# Patient Record
Sex: Female | Born: 1935 | Race: White | Hispanic: No | Smoking: Never smoker
Health system: Southern US, Community
[De-identification: ages and names within clinical notes are randomized; demographics above are authoritative.]

## PROBLEM LIST (undated history)

## (undated) DIAGNOSIS — K649 Unspecified hemorrhoids: Secondary | ICD-10-CM

## (undated) DIAGNOSIS — M1990 Unspecified osteoarthritis, unspecified site: Secondary | ICD-10-CM

## (undated) DIAGNOSIS — R32 Unspecified urinary incontinence: Secondary | ICD-10-CM

## (undated) DIAGNOSIS — I2510 Atherosclerotic heart disease of native coronary artery without angina pectoris: Secondary | ICD-10-CM

## (undated) DIAGNOSIS — K219 Gastro-esophageal reflux disease without esophagitis: Secondary | ICD-10-CM

## (undated) DIAGNOSIS — R251 Tremor, unspecified: Secondary | ICD-10-CM

## (undated) DIAGNOSIS — E559 Vitamin D deficiency, unspecified: Secondary | ICD-10-CM

## (undated) DIAGNOSIS — M75102 Unspecified rotator cuff tear or rupture of left shoulder, not specified as traumatic: Secondary | ICD-10-CM

## (undated) DIAGNOSIS — K635 Polyp of colon: Secondary | ICD-10-CM

## (undated) DIAGNOSIS — E7800 Pure hypercholesterolemia, unspecified: Secondary | ICD-10-CM

## (undated) DIAGNOSIS — E079 Disorder of thyroid, unspecified: Secondary | ICD-10-CM

## (undated) DIAGNOSIS — I519 Heart disease, unspecified: Secondary | ICD-10-CM

## (undated) DIAGNOSIS — G5600 Carpal tunnel syndrome, unspecified upper limb: Secondary | ICD-10-CM

## (undated) DIAGNOSIS — Z9889 Other specified postprocedural states: Secondary | ICD-10-CM

## (undated) DIAGNOSIS — I10 Essential (primary) hypertension: Secondary | ICD-10-CM

## (undated) DIAGNOSIS — D369 Benign neoplasm, unspecified site: Secondary | ICD-10-CM

## (undated) DIAGNOSIS — H8110 Benign paroxysmal vertigo, unspecified ear: Secondary | ICD-10-CM

## (undated) DIAGNOSIS — K5790 Diverticulosis of intestine, part unspecified, without perforation or abscess without bleeding: Secondary | ICD-10-CM

## (undated) DIAGNOSIS — G459 Transient cerebral ischemic attack, unspecified: Secondary | ICD-10-CM

## (undated) DIAGNOSIS — E039 Hypothyroidism, unspecified: Secondary | ICD-10-CM

## (undated) DIAGNOSIS — I639 Cerebral infarction, unspecified: Secondary | ICD-10-CM

## (undated) DIAGNOSIS — I253 Aneurysm of heart: Secondary | ICD-10-CM

## (undated) DIAGNOSIS — I700 Atherosclerosis of aorta: Secondary | ICD-10-CM

## (undated) DIAGNOSIS — Z7982 Long term (current) use of aspirin: Secondary | ICD-10-CM

## (undated) DIAGNOSIS — K449 Diaphragmatic hernia without obstruction or gangrene: Secondary | ICD-10-CM

## (undated) DIAGNOSIS — G479 Sleep disorder, unspecified: Secondary | ICD-10-CM

## (undated) HISTORY — PX: ABDOMINAL HYSTERECTOMY: SHX81

## (undated) HISTORY — PX: COLONOSCOPY: SHX174

## (undated) HISTORY — PX: TONSILLECTOMY: SUR1361

## (undated) HISTORY — PX: CARDIAC CATHETERIZATION: SHX172

## (undated) HISTORY — PX: EXCISION OF BREAST LESION: SHX6676

## (undated) HISTORY — PX: FOOT SURGERY: SHX648

---

## 2007-02-17 ENCOUNTER — Ambulatory Visit: Payer: Self-pay | Admitting: Gastroenterology

## 2007-07-22 ENCOUNTER — Ambulatory Visit: Payer: Self-pay | Admitting: Cardiology

## 2007-07-22 DIAGNOSIS — I2510 Atherosclerotic heart disease of native coronary artery without angina pectoris: Secondary | ICD-10-CM

## 2007-07-22 HISTORY — PX: LEFT HEART CATH AND CORONARY ANGIOGRAPHY: CATH118249

## 2007-07-22 HISTORY — DX: Atherosclerotic heart disease of native coronary artery without angina pectoris: I25.10

## 2009-02-08 ENCOUNTER — Ambulatory Visit: Payer: Self-pay | Admitting: Family Medicine

## 2009-03-07 ENCOUNTER — Ambulatory Visit: Payer: Self-pay | Admitting: Cardiology

## 2009-09-27 ENCOUNTER — Emergency Department: Payer: Self-pay | Admitting: Emergency Medicine

## 2009-11-12 ENCOUNTER — Ambulatory Visit: Payer: Self-pay | Admitting: Internal Medicine

## 2010-03-07 ENCOUNTER — Ambulatory Visit: Payer: Self-pay | Admitting: Family Medicine

## 2010-04-10 ENCOUNTER — Ambulatory Visit: Payer: Self-pay | Admitting: Gastroenterology

## 2010-04-12 LAB — PATHOLOGY REPORT

## 2011-03-25 ENCOUNTER — Ambulatory Visit: Payer: Self-pay | Admitting: Family Medicine

## 2011-05-08 IMAGING — CR DG HAND COMPLETE 3+V*L*
1 series · 3 of 3 positions shown · non-contrast
Comparison: none

REASON FOR EXAM: pain and swelling
COMMENTS:   May transport without cardiac monitor

PROCEDURE:     DXR - DXR HAND LT COMPLETE  W/OBLIQUES  - September 27, 2009  [DATE]
RESULT:     Comparison:  None

[Series 1: view not recorded · 0.17mm/px · 3 of 3 slices shown]
[im 1/3]
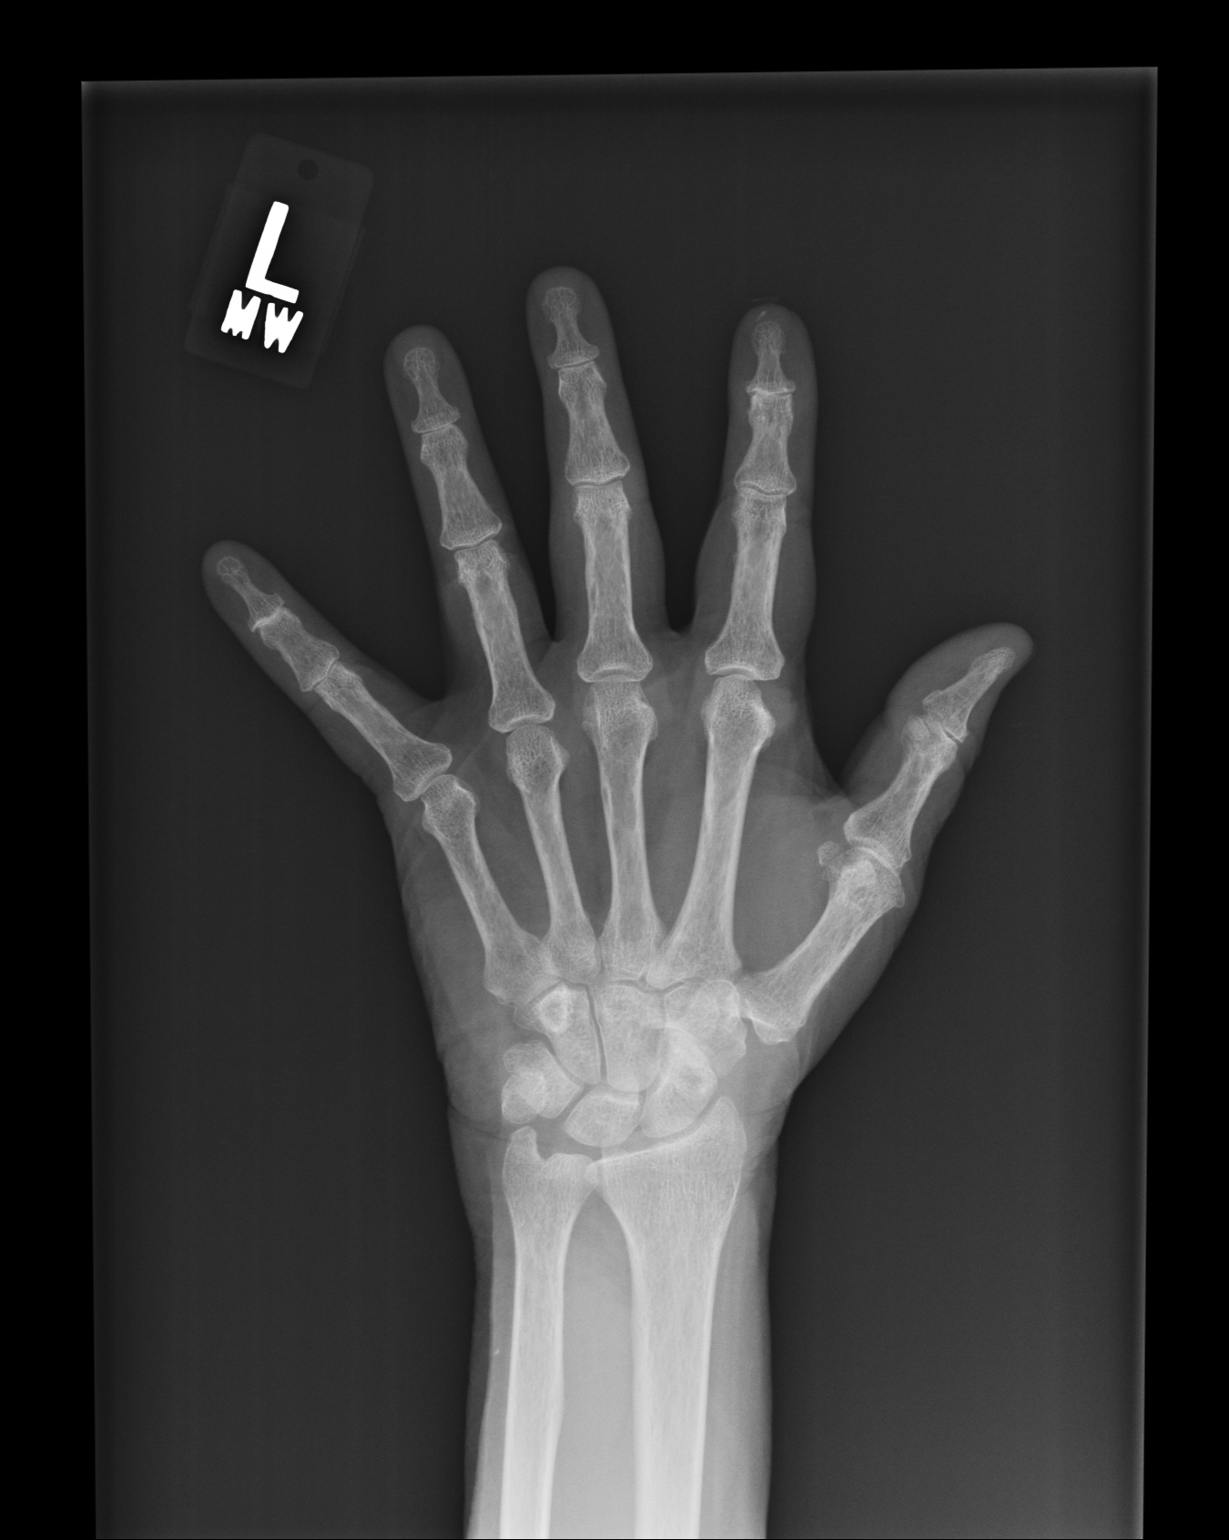
[im 2/3]
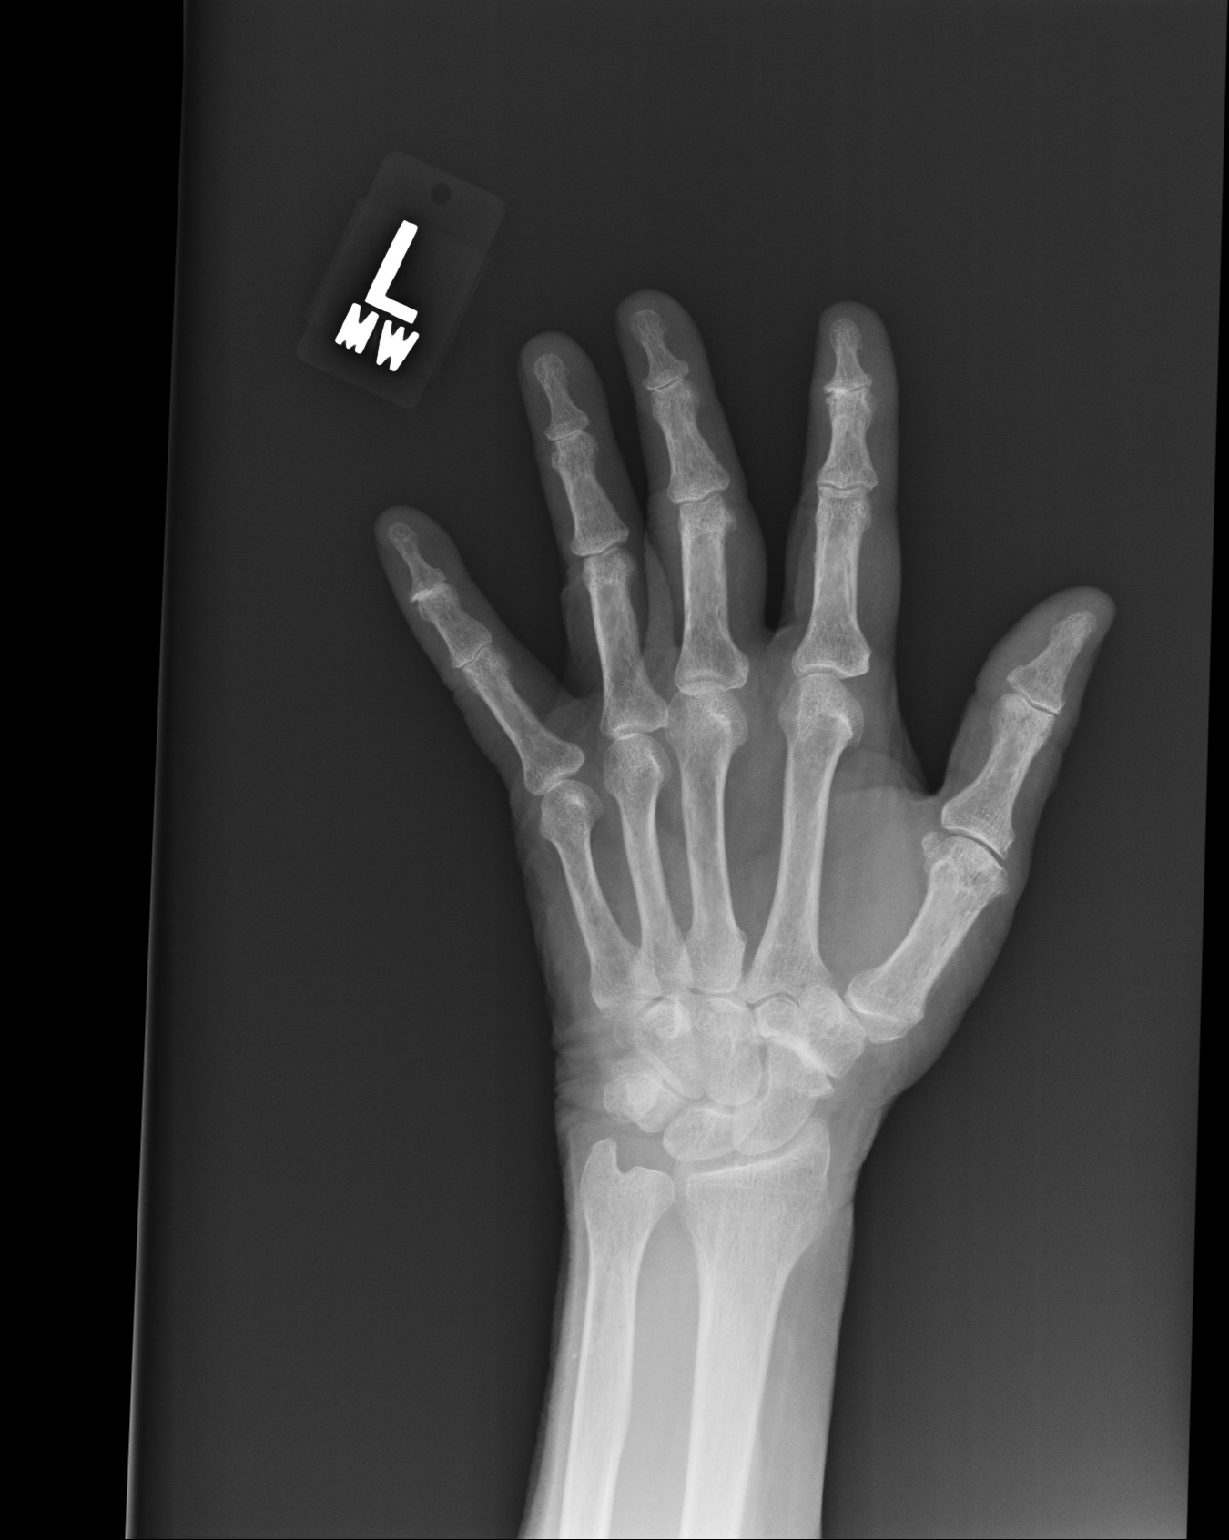
[im 3/3]
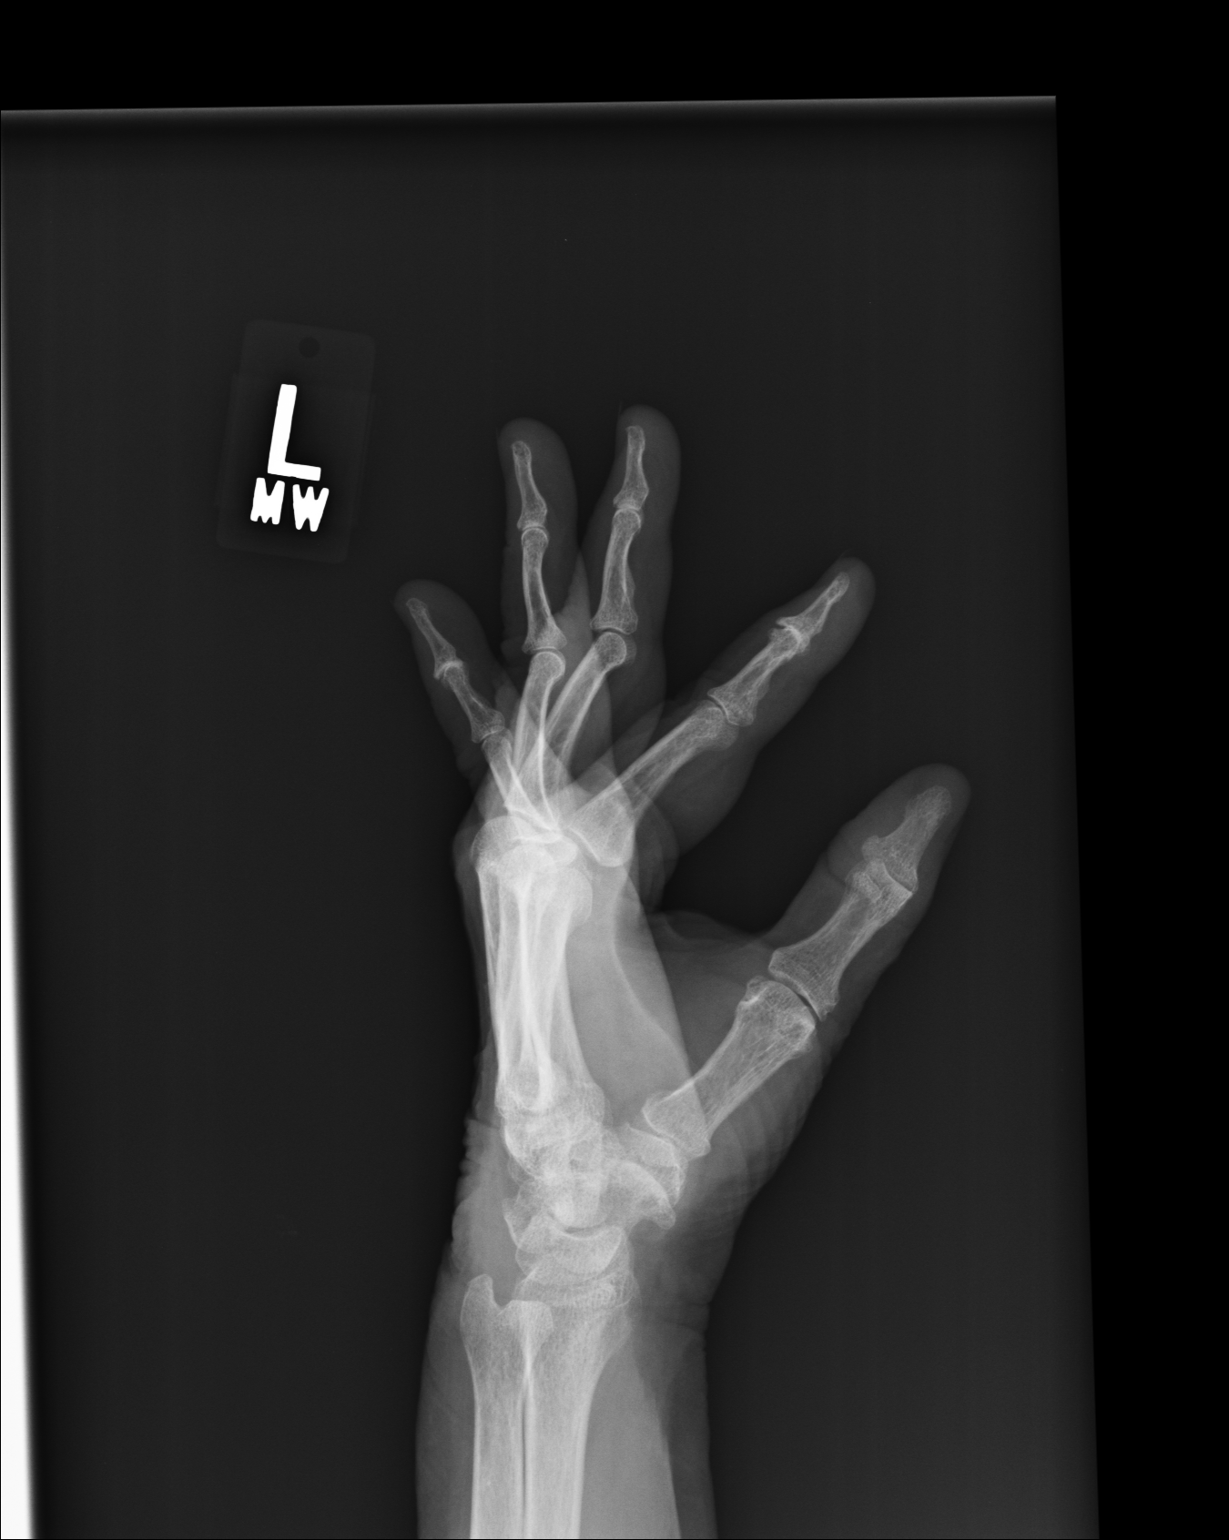

[3 of 3 positions shown; findings below may reference images not displayed]

FINDINGS: AP, oblique, and lateral views of the left hand demonstrates no fracture or
dislocation. There is normal bone mineralization. There are no erosive
changes. There are degenerative changes of the second DIP joint and fifth
DIP joint. There is no soft tissue swelling.
IMPRESSION: No acute osseous abnormality of the left hand.

## 2011-05-08 IMAGING — CR LEFT WRIST - COMPLETE 3+ VIEW
1 series · 4 of 4 positions shown · non-contrast
Comparison: None

REASON FOR EXAM: pain and ecchymosis
COMMENTS:   May transport without cardiac monitor

PROCEDURE:     DXR - DXR WRIST LT COMP WITH OBLIQUES  - September 27, 2009  [DATE]
RESULT:     History: Fall

[Series 1: view not recorded · 0.17mm/px · 4 of 4 slices shown]
[im 1/4]
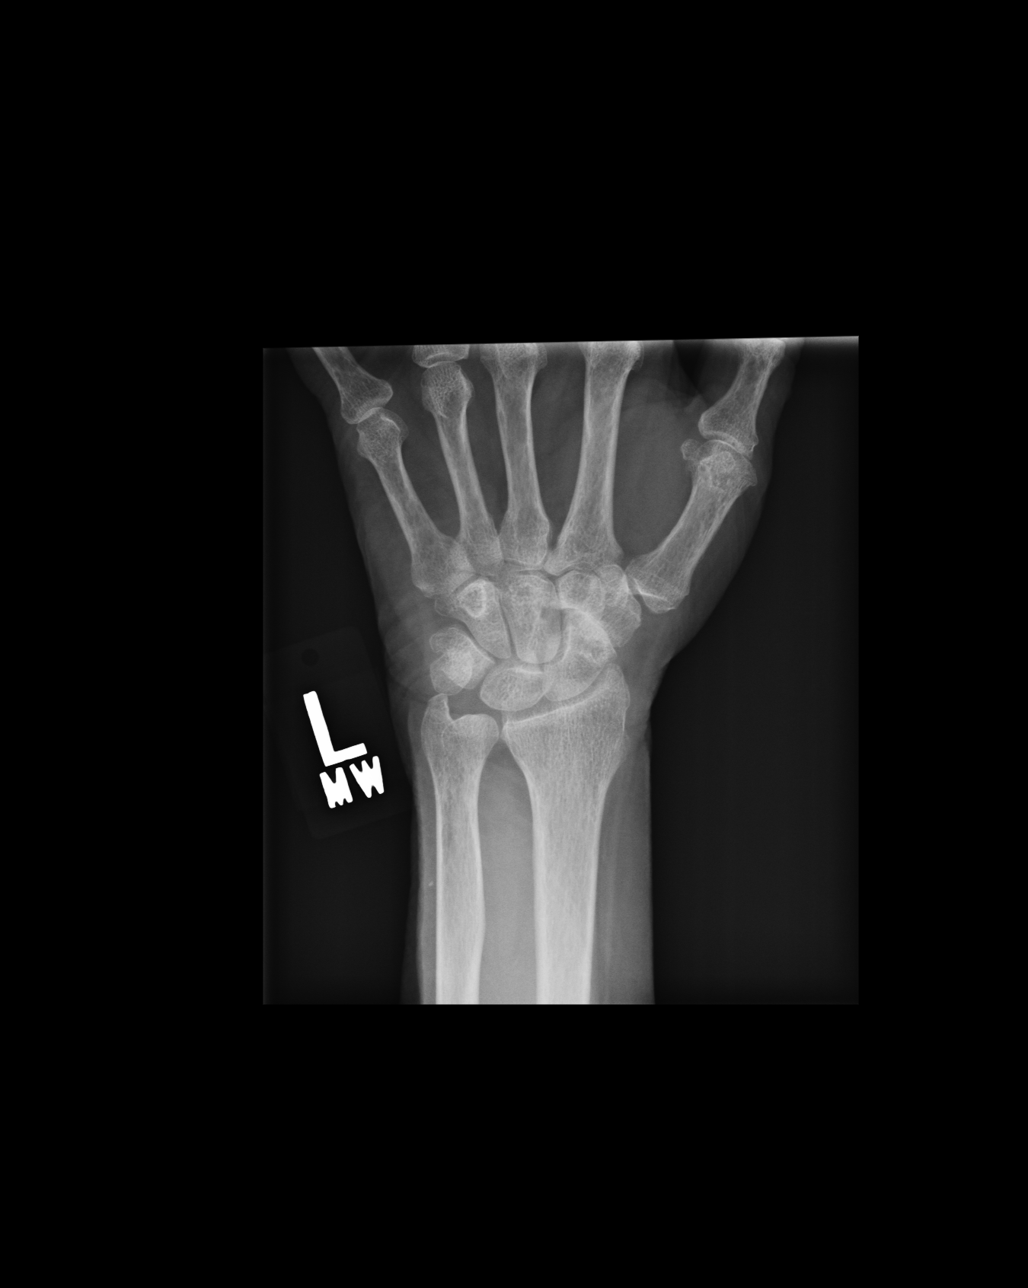
[im 2/4]
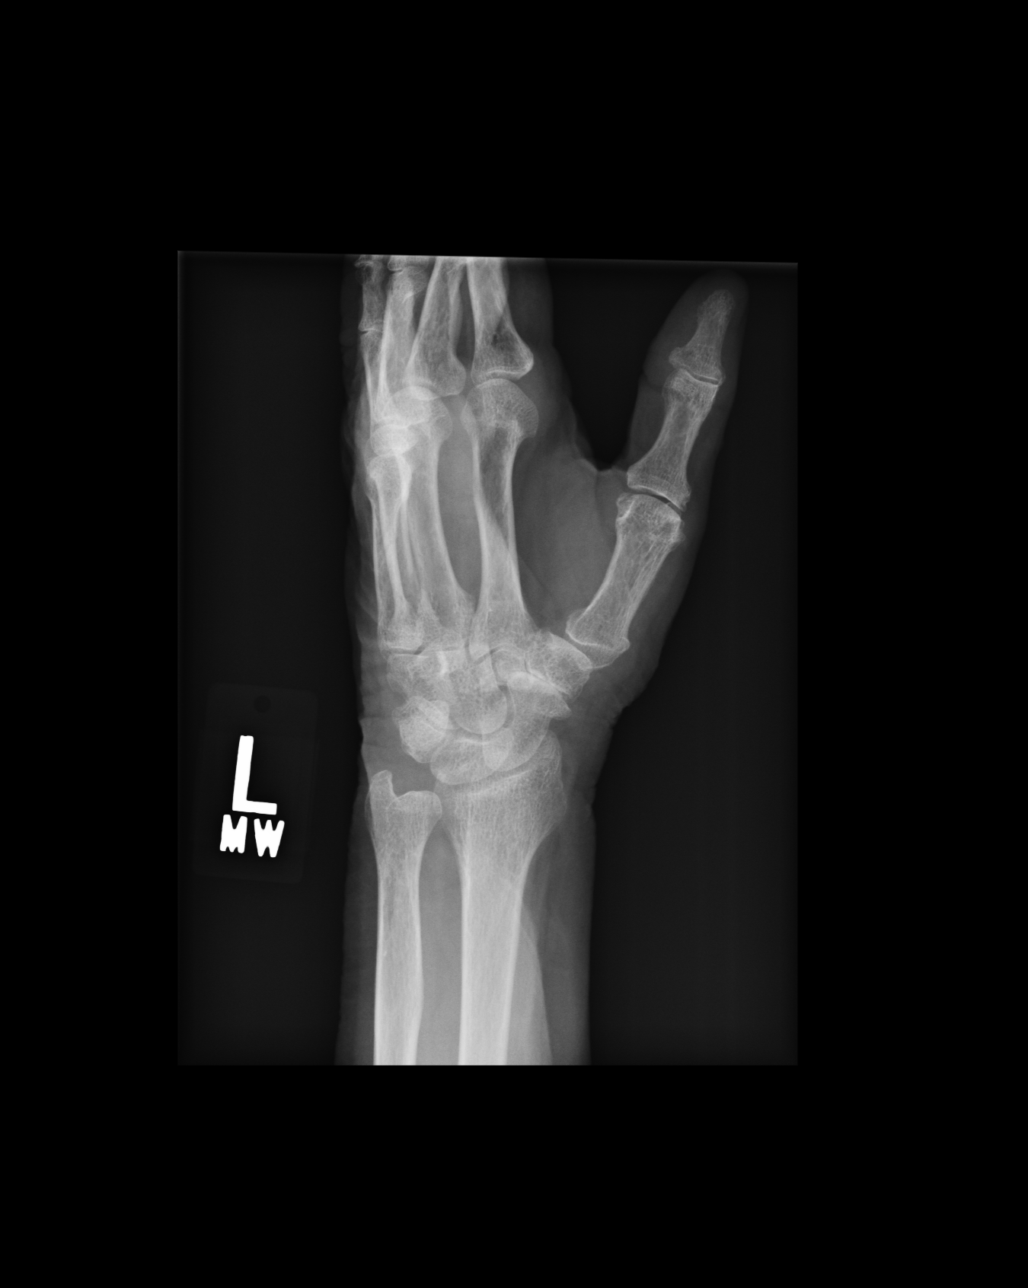
[im 3/4]
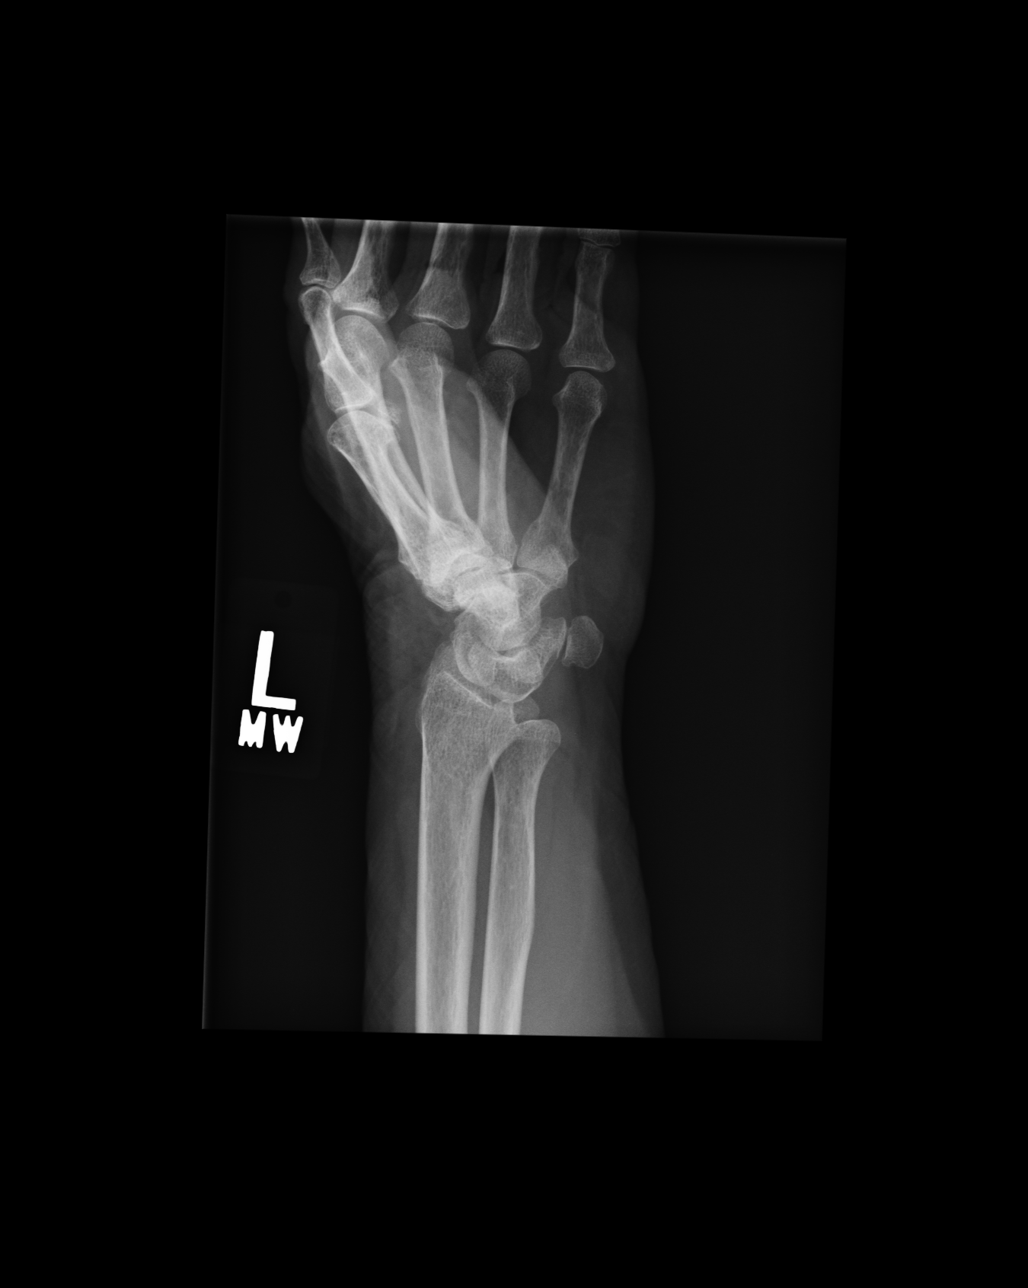
[im 4/4]
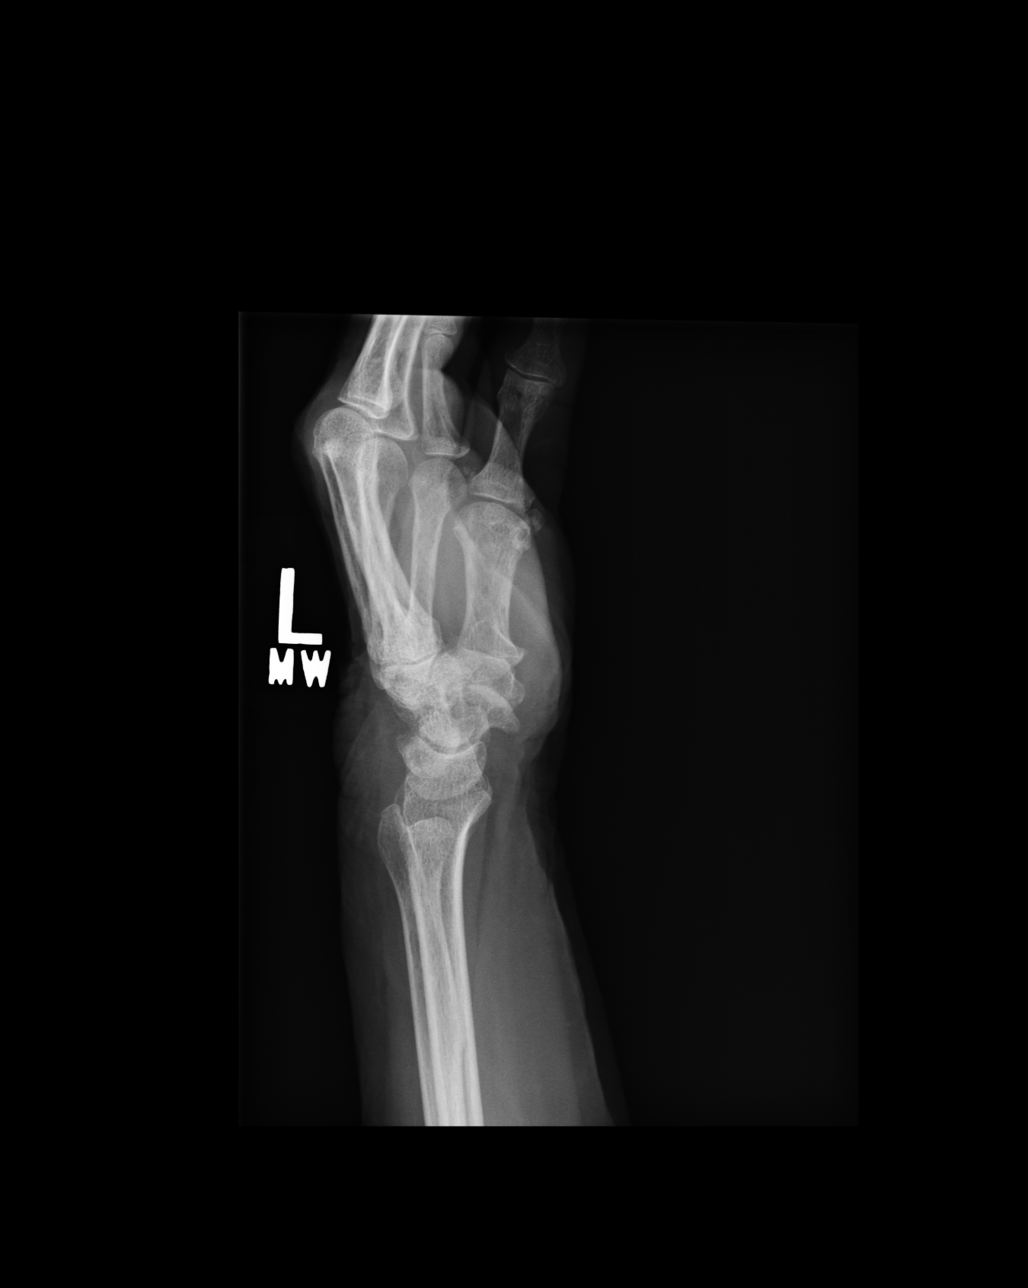

[4 of 4 positions shown; findings below may reference images not displayed]

FINDINGS: Four views of the left wrist demonstrates no acute fracture or dislocation.
There is generalized osteopenia. The joint spaces are maintained. The soft
tissues appear normal.
IMPRESSION: No acute osseous abnormality of the left wrist.

If there is clinical concern regarding a radiographically occult scaphoid
fracture, snuff box tenderness, or ligamentous injury, further assessment
with MRI is recommended.

## 2012-03-25 ENCOUNTER — Ambulatory Visit: Payer: Self-pay | Admitting: Family Medicine

## 2012-09-15 ENCOUNTER — Ambulatory Visit: Payer: Self-pay | Admitting: Family Medicine

## 2013-03-29 ENCOUNTER — Ambulatory Visit: Payer: Self-pay | Admitting: Family Medicine

## 2013-06-22 ENCOUNTER — Encounter: Payer: Self-pay | Admitting: Neurology

## 2013-06-23 ENCOUNTER — Encounter: Payer: Self-pay | Admitting: Neurology

## 2013-06-23 HISTORY — PX: ROTATOR CUFF REPAIR: SHX139

## 2013-07-27 ENCOUNTER — Ambulatory Visit: Payer: Self-pay | Admitting: Cardiology

## 2013-07-27 HISTORY — PX: LEFT HEART CATH AND CORONARY ANGIOGRAPHY: CATH118249

## 2013-09-19 ENCOUNTER — Ambulatory Visit: Payer: Self-pay | Admitting: Gastroenterology

## 2013-09-28 ENCOUNTER — Ambulatory Visit: Payer: Self-pay | Admitting: Gastroenterology

## 2014-01-14 ENCOUNTER — Ambulatory Visit: Payer: Self-pay | Admitting: Internal Medicine

## 2014-04-06 ENCOUNTER — Ambulatory Visit: Payer: Self-pay | Admitting: Family Medicine

## 2014-04-18 ENCOUNTER — Ambulatory Visit: Payer: Self-pay | Admitting: Family Medicine

## 2014-05-03 ENCOUNTER — Ambulatory Visit: Payer: Self-pay | Admitting: Surgery

## 2014-05-29 ENCOUNTER — Ambulatory Visit: Payer: Self-pay | Admitting: Physician Assistant

## 2014-06-23 DIAGNOSIS — R55 Syncope and collapse: Secondary | ICD-10-CM

## 2014-06-23 HISTORY — DX: Syncope and collapse: R55

## 2014-08-01 ENCOUNTER — Ambulatory Visit: Payer: Self-pay | Admitting: Podiatry

## 2014-08-03 ENCOUNTER — Ambulatory Visit: Payer: Self-pay | Admitting: Podiatry

## 2014-08-11 ENCOUNTER — Ambulatory Visit: Payer: Self-pay | Admitting: Podiatry

## 2014-09-02 ENCOUNTER — Ambulatory Visit: Payer: Self-pay | Admitting: Family Medicine

## 2014-10-16 LAB — SURGICAL PATHOLOGY

## 2014-10-22 NOTE — Op Note (Signed)
PATIENT NAME:  Janet Spears, Janet Spears MR#:  [id number] DATE OF BIRTH:  Aug 09, 1935  DATE OF PROCEDURE:  08/11/2014  PREOPERATIVE DIAGNOSIS:  Right midfoot arthritis.   POSTOPERATIVE DIAGNOSIS:  Right midfoot arthritis.  PROCEDURE: 1.  First metatarsal-cuneiform joint fusion.  2.  Second metatarsal-cuneiform joint fusion.  3.  Second metatarsal and medial-cuneiform joint fusion of right foot.   SURGEON: Arshan Jabs A. Vickki Muff, DPM.   ANESTHESIA: General with popliteal block.   HEMOSTASIS: Thigh tourniquet inflated to 250 mmHg for 120 minutes.   COMPLICATIONS: None.   SPECIMEN: None.   OPERATIVE INDICATIONS: This is a 79 year old female with complaint of severe chronic right foot pain. We have undergone conservative treatment and she presents today for surgery. The risks, benefits, alternatives, and complications associated with surgery were discussed with the patient and informed consent has been given.   OPERATIVE PROCEDURE: The patient was brought into the OR and placed on the operating table in the supine position. General intubation was administered by the anesthesia team.  A popliteal block had been placed previously. The right lower extremity was then prepped and draped in the usual sterile fashion.  Attention was directed to the dorsal aspect of the foot where a longitudinal incision was made from the talonavicular joint region to the mid shaft of the first and second metatarsals. This was basically along the intercuneiform joint region. Sharp and blunt dissection was carried down to the midtarsal joint region. Subperiosteal dissection was then undertaken. At this point, the first metatarsal-cuneiform joint, second metatarsal-cuneiform joint and Lisfrancs joint at the second metatarsal-cuneiform region was opened up. There was noted to be arthritic changes of the first and second metatarsal-cuneiform joint. There was a prominent spur in the plantar aspect of the Lisfranc ligament region. All areas  were removed of cartilaginous material.  Lisfranc ligament was removed between the second metatarsal-cuneiform joint site.  All areas were then prepped down to healthy bleeding bone. All areas were then prepped with a 1.5 mm wire-passing drill bit.  First a 4-0 cannulated screw from the medial cuneiform to the second metatarsal base was placed.  This was to compress the [employer] area. Prior to final compression, the joint spaces were all infiltrated with DBX bone graft.  Good compression and stability was noted at this time.  Next, a 2.5 mm in-line fusion plate was placed on the dorsal aspect of the second metatarsal-cuneiform joint.  The 2 distal screws were locking screws. A compression screw was placed in the proximal aspect.  A locking 2.5 mm screw was placed just proximal to the second metatarsal-cuneiform fusion site. This was from the [employer] fusion foot plate set.  Finally, a 3.5 mm locking plate was placed on the first metatarsal-cuneiform joint.  One distal screw was a compression nonlocking screw.  The proximal screw was a nonlocking screw to compress the joint. The remaining 2 screw sites were filled with locking screws.  Good alignment was noted in all planes. At this time, the wounds were flushed with copious amounts of irrigation.  Further bone putty was placed along all fusion sites. Layered skin closure was performed with a 3-0 Vicryl and 4-0 Vicryl and a 5-0 Monocryl undyed for skin; 0.25% Marcaine block was placed around all sites.  She was placed in a well compressive sterile dressing and placed in her boot. She tolerated the procedure and anesthesia and was transported from the OR to the PACU with all vital signs stable and neurovascular status intact.  I will see her  in the outpatient clinic in 5 to 7 days.  She is to remain nonweightbearing. We will get her a prescription for Percocet for pain.     ____________________________ Pete Glatter. Vickki Muff, DPM jaf:DT D: 08/11/2014 12:47:47  ET T: 08/11/2014 13:38:24 ET JOB#: [id number]  cc: Larkin Ina A. Vickki Muff, DPM, <Dictator> Wisam Siefring DPM ELECTRONICALLY SIGNED 08/23/2014 14:12

## 2015-05-05 ENCOUNTER — Observation Stay
Admission: EM | Admit: 2015-05-05 | Discharge: 2015-05-06 | Disposition: A | Discharge status: Home / Self Care | Payer: Medicare Other | Attending: Internal Medicine | Admitting: Internal Medicine

## 2015-05-05 ENCOUNTER — Emergency Department: Payer: Medicare Other

## 2015-05-05 ENCOUNTER — Encounter: Payer: Self-pay | Admitting: Emergency Medicine

## 2015-05-05 DIAGNOSIS — R51 Headache: Secondary | ICD-10-CM | POA: Insufficient documentation

## 2015-05-05 DIAGNOSIS — K219 Gastro-esophageal reflux disease without esophagitis: Secondary | ICD-10-CM | POA: Diagnosis not present

## 2015-05-05 DIAGNOSIS — R42 Dizziness and giddiness: Secondary | ICD-10-CM | POA: Insufficient documentation

## 2015-05-05 DIAGNOSIS — R110 Nausea: Secondary | ICD-10-CM | POA: Insufficient documentation

## 2015-05-05 DIAGNOSIS — E7800 Pure hypercholesterolemia, unspecified: Secondary | ICD-10-CM | POA: Insufficient documentation

## 2015-05-05 DIAGNOSIS — E039 Hypothyroidism, unspecified: Secondary | ICD-10-CM | POA: Diagnosis present

## 2015-05-05 DIAGNOSIS — R55 Syncope and collapse: Secondary | ICD-10-CM | POA: Diagnosis present

## 2015-05-05 DIAGNOSIS — K529 Noninfective gastroenteritis and colitis, unspecified: Secondary | ICD-10-CM | POA: Diagnosis not present

## 2015-05-05 DIAGNOSIS — I2510 Atherosclerotic heart disease of native coronary artery without angina pectoris: Secondary | ICD-10-CM | POA: Diagnosis not present

## 2015-05-05 DIAGNOSIS — G319 Degenerative disease of nervous system, unspecified: Secondary | ICD-10-CM | POA: Diagnosis not present

## 2015-05-05 DIAGNOSIS — R079 Chest pain, unspecified: Secondary | ICD-10-CM | POA: Diagnosis present

## 2015-05-05 DIAGNOSIS — I10 Essential (primary) hypertension: Secondary | ICD-10-CM | POA: Diagnosis not present

## 2015-05-05 DIAGNOSIS — E785 Hyperlipidemia, unspecified: Secondary | ICD-10-CM | POA: Diagnosis present

## 2015-05-05 HISTORY — DX: Gastro-esophageal reflux disease without esophagitis: K21.9

## 2015-05-05 HISTORY — DX: Disorder of thyroid, unspecified: E07.9

## 2015-05-05 HISTORY — DX: Pure hypercholesterolemia, unspecified: E78.00

## 2015-05-05 HISTORY — DX: Essential (primary) hypertension: I10

## 2015-05-05 LAB — COMPREHENSIVE METABOLIC PANEL
ALBUMIN: 4.1 g/dL (ref 3.5–5.0)
ALT: 13 U/L — ABNORMAL LOW (ref 14–54)
ANION GAP: 8 (ref 5–15)
AST: 20 U/L (ref 15–41)
Alkaline Phosphatase: 63 U/L (ref 38–126)
BUN: 29 mg/dL — ABNORMAL HIGH (ref 6–20)
CO2: 28 mmol/L (ref 22–32)
Calcium: 9.2 mg/dL (ref 8.9–10.3)
Chloride: 106 mmol/L (ref 101–111)
Creatinine, Ser: 0.83 mg/dL (ref 0.44–1.00)
GFR calc non Af Amer: >60 mL/min (ref >60)
GLUCOSE: 109 mg/dL — AB (ref 65–99)
POTASSIUM: 4 mmol/L (ref 3.5–5.1)
SODIUM: 142 mmol/L (ref 135–145)
TOTAL PROTEIN: 7.1 g/dL (ref 6.5–8.1)
Total Bilirubin: 0.6 mg/dL (ref 0.3–1.2)

## 2015-05-05 LAB — URINALYSIS COMPLETE WITH MICROSCOPIC (ARMC ONLY)
Bilirubin Urine: NEGATIVE
Glucose, UA: NEGATIVE mg/dL
HGB URINE DIPSTICK: NEGATIVE
Nitrite: NEGATIVE
PH: 6 (ref 5.0–8.0)
Protein, ur: NEGATIVE mg/dL
Specific Gravity, Urine: 1.024 (ref 1.005–1.030)

## 2015-05-05 LAB — CBC
HCT: 38.8 % (ref 35.0–47.0)
HEMOGLOBIN: 12.9 g/dL (ref 12.0–16.0)
MCH: 32.9 pg (ref 26.0–34.0)
MCHC: 33.2 g/dL (ref 32.0–36.0)
MCV: 99.1 fL (ref 80.0–100.0)
Platelets: 157 10*3/uL (ref 150–440)
RBC: 3.92 MIL/uL (ref 3.80–5.20)
RDW: 13.4 % (ref 11.5–14.5)
WBC: 10.9 10*3/uL (ref 3.6–11.0)

## 2015-05-05 LAB — TROPONIN I
Troponin I: <0.03 ng/mL (ref <0.031)
Troponin I: <0.03 ng/mL (ref <0.031)

## 2015-05-05 LAB — MAGNESIUM: Magnesium: 2 mg/dL (ref 1.7–2.4)

## 2015-05-05 MED ORDER — LEVOTHYROXINE SODIUM 100 MCG PO TABS
100.0000 ug | ORAL_TABLET | Freq: Every day | ORAL | Status: DC
  Filled 2015-05-05: qty 1

## 2015-05-05 MED ORDER — BISOPROLOL-HYDROCHLOROTHIAZIDE 5-6.25 MG PO TABS
1.0000 | ORAL_TABLET | Freq: Every day | ORAL | Status: DC
  Filled 2015-05-05 (×2): qty 1

## 2015-05-05 MED ORDER — ATORVASTATIN CALCIUM 20 MG PO TABS: 40.0000 mg | ORAL_TABLET | Freq: Every day | ORAL | Status: DC

## 2015-05-05 MED ORDER — SODIUM CHLORIDE 0.9 % IJ SOLN
3.0000 mL | Freq: Two times a day (BID) | INTRAMUSCULAR | Status: DC
  Administered 2015-05-05: 3 mL via INTRAVENOUS

## 2015-05-05 MED ORDER — MORPHINE SULFATE (PF) 4 MG/ML IV SOLN
4.0000 mg | Freq: Once | INTRAVENOUS | Status: AC
  Administered 2015-05-05: 4 mg via INTRAVENOUS
  Filled 2015-05-05: qty 1

## 2015-05-05 MED ORDER — MORPHINE SULFATE (PF) 2 MG/ML IV SOLN: 2.0000 mg | INTRAVENOUS | Status: DC | PRN

## 2015-05-05 MED ORDER — PANTOPRAZOLE SODIUM 40 MG PO TBEC
40.0000 mg | DELAYED_RELEASE_TABLET | Freq: Every day | ORAL | Status: DC
  Filled 2015-05-05: qty 1

## 2015-05-05 MED ORDER — SCOPOLAMINE 1 MG/3DAYS TD PT72: 1.0000 | MEDICATED_PATCH | TRANSDERMAL | Status: DC

## 2015-05-05 MED ORDER — NITROGLYCERIN 0.4 MG SL SUBL: 0.4000 mg | SUBLINGUAL_TABLET | SUBLINGUAL | Status: DC | PRN

## 2015-05-05 MED ORDER — ONDANSETRON HCL 4 MG/2ML IJ SOLN: 4.0000 mg | Freq: Four times a day (QID) | INTRAMUSCULAR | Status: DC | PRN

## 2015-05-05 MED ORDER — VITAMIN B-12 1000 MCG PO TABS
1000.0000 ug | ORAL_TABLET | Freq: Every day | ORAL | Status: DC
  Filled 2015-05-05: qty 1

## 2015-05-05 MED ORDER — LISINOPRIL 5 MG PO TABS
5.0000 mg | ORAL_TABLET | Freq: Every day | ORAL | Status: DC
  Filled 2015-05-05: qty 1

## 2015-05-05 MED ORDER — OMEGA-3-ACID ETHYL ESTERS 1 G PO CAPS
1000.0000 mg | ORAL_CAPSULE | Freq: Two times a day (BID) | ORAL | Status: DC
  Filled 2015-05-05: qty 1

## 2015-05-05 MED ORDER — PRIMIDONE 50 MG PO TABS
50.0000 mg | ORAL_TABLET | Freq: Two times a day (BID) | ORAL | Status: DC
  Filled 2015-05-05: qty 1

## 2015-05-05 MED ORDER — ENOXAPARIN SODIUM 40 MG/0.4ML ~~LOC~~ SOLN
40.0000 mg | Freq: Every day | SUBCUTANEOUS | Status: DC
  Administered 2015-05-05: 40 mg via SUBCUTANEOUS
  Filled 2015-05-05: qty 0.4

## 2015-05-05 MED ORDER — ACETAMINOPHEN ER 650 MG PO TBCR: 650.0000 mg | EXTENDED_RELEASE_TABLET | Freq: Every day | ORAL | Status: DC

## 2015-05-05 MED ORDER — ASPIRIN EC 81 MG PO TBEC: 81.0000 mg | DELAYED_RELEASE_TABLET | Freq: Every day | ORAL | Status: DC

## 2015-05-05 MED ORDER — POTASSIUM CHLORIDE IN NACL 20-0.9 MEQ/L-% IV SOLN
INTRAVENOUS | Status: AC
  Administered 2015-05-05: 23:00:00 via INTRAVENOUS
  Filled 2015-05-05: qty 1000

## 2015-05-05 MED ORDER — VITAMIN E 45 MG (100 UNIT) PO CAPS: 1000.0000 [IU] | ORAL_CAPSULE | Freq: Every day | ORAL | Status: DC

## 2015-05-05 MED ORDER — POLYETHYLENE GLYCOL 3350 17 G PO PACK
17.0000 g | PACK | Freq: Every day | ORAL | Status: DC
  Filled 2015-05-05: qty 1

## 2015-05-05 MED ORDER — MECLIZINE HCL 25 MG PO TABS: 12.5000 mg | ORAL_TABLET | Freq: Every day | ORAL | Status: DC | PRN

## 2015-05-05 MED ORDER — ONDANSETRON HCL 4 MG/2ML IJ SOLN
4.0000 mg | Freq: Once | INTRAMUSCULAR | Status: AC
  Administered 2015-05-05: 4 mg via INTRAVENOUS
  Filled 2015-05-05: qty 2

## 2015-05-05 MED ORDER — ACETAMINOPHEN 325 MG PO TABS
650.0000 mg | ORAL_TABLET | ORAL | Status: DC | PRN
  Administered 2015-05-05: 650 mg via ORAL
  Filled 2015-05-05: qty 2

## 2015-05-05 NOTE — ED Notes (Signed)
IV removed that EMS placed. When gown was put on upon arrive it must have been pulled and was partcially out.  LM EDT.

## 2015-05-05 NOTE — ED Provider Notes (Signed)
[facility] Emergency Department Provider Note  Time seen: 3:41 PM  I have reviewed the triage vital signs and the nursing notes.   HISTORY  Chief Complaint Loss of Consciousness and Nausea    HPI Janet Spears is a 79 y.o. female with a past medical history of hypertension, hyperlipidemia, gastric reflux who presents the emergency department after syncope. According to the patient she has felt dizzy all morning, she was ironing clothes when she began feeling chest pain, had a syncopal episode and was awoken by family member covered in vomit. Patient does not recall vomiting. States she was continuing to have chest pain, was given nitroglycerin by EMS, with some resolution of her chest pain. Patient then developed a headache. Patient is not sure if she had a headache before the event but definitely has one now. Describes her chest pain currently is mild, headache is moderate. Feels mildly nauseated as well. Denies any shortness of breath now or at any time.     Past Medical History  Diagnosis Date  . Thyroid disease   . Hypertension   . Hypercholesteremia   . GERD (gastroesophageal reflux disease)     There are no active problems to display for this patient.   Past Surgical History  Procedure Laterality Date  . Foot surgery      No current outpatient prescriptions on file.  Allergies Review of patient's allergies indicates no known allergies.  No family history on file.  Social History Social History  Substance Use Topics  . Smoking status: Never Smoker   . Smokeless tobacco: None  . Alcohol Use: No    Review of Systems Constitutional: Negative for fever. Positive for dizziness. Positive for syncope. Cardiovascular: Positive for chest pain. Respiratory: Negative for shortness of breath. Gastrointestinal: Negative for abdominal pain. Positive for nausea and vomiting. Negative for diarrhea. Genitourinary: Negative for  dysuria. Musculoskeletal: Negative for back pain. Neurological: As it for headache. Denies focal weakness or numbness. 10-point ROS otherwise negative.  ____________________________________________   PHYSICAL EXAM:  Constitutional: Alert and oriented. Well appearing and in no distress. Eyes: Normal exam ENT   Head: Normocephalic and atraumatic.   Mouth/Throat: Mucous membranes are moist. Cardiovascular: Normal rate, regular rhythm. No murmur Respiratory: Normal respiratory effort without tachypnea nor retractions. Breath sounds are clear  Gastrointestinal: Soft and nontender. No distention.  Musculoskeletal: Nontender with normal range of motion in all extremities.  Neurologic:  Normal speech and language. No gross focal neurologic deficits  Skin:  Skin is warm, dry and intact.  Psychiatric: Mood and affect are normal. Speech and behavior are normal.  ____________________________________________    EKG  EKG reviewed and interpreted by myself shows normal sinus rhythm at 71 bpm, narrow QRS, normal axis, normal intervals, nonspecific ST changes present. No st elevations noted.  ____________________________________________    RADIOLOGY  CT head shows no acute abnormality.  ____________________________________________    INITIAL IMPRESSION / ASSESSMENT AND PLAN / ED COURSE  Pertinent labs & imaging results that were available during my care of the patient were reviewed by me and considered in my medical decision making (see chart for details).  Patient presents the emergency department with chest pain and a syncopal episode. States the chest pain is largely improved after nitroglycerin but now she has a headache. No focal neurologic deficits on exam. We'll treat the patient's discomfort, a CT head, labs to help further evaluate. Given her chest pain with syncopal episode anticipate likely admission.   Labs are within  normal limits. CT head negative. Given the  patient's chest pain was syncopal episode nausea/vomiting. We'll admit the patient for further workup and evaluation.  ____________________________________________   FINAL CLINICAL IMPRESSION(S) / ED DIAGNOSES  Syncope Chest pain   Harvest Dark, MD 05/05/15 [id number]

## 2015-05-05 NOTE — H&P (Signed)
[facility] [place] NAME: Janet Spears    MR#:  [id number]  DATE OF BIRTH:  1936/03/21  DATE OF ADMISSION:  05/05/2015  PRIMARY CARE PHYSICIAN: No primary care provider on file.   REQUESTING/REFERRING PHYSICIAN: Harvest Dark, MD  CHIEF COMPLAINT:   Chief Complaint  Patient presents with  . Loss of Consciousness  . Nausea    HISTORY OF PRESENT ILLNESS:  Janet Spears  is a 79 y.o. female with a known history of CAD with 20% blockage as per patient, hypothyroidism, hypertension, hyperlipidemia, GERD who was brought to the emergency department after losing consciousness at home. Per patient, around 13:30 today, she was ironing when she fell that she was having worsening of nausea and dizziness associated with chest pressure. She did not feel well and called for family member to assist her. The next thing she remembers is waking up cover in her own vomit. She states that she does not remember vomiting, but when she woke up her chest pressure was more intense.   EMS was called and when they arrived gave the patient sublingual nitroglycerin, which decreased the chest pain significantly, but the patient developed a severe headache afterwards. However, at this time the chest pain and a headache had resolved completely after she was given morphine in the emergency department. She is in no acute distress.  PAST MEDICAL HISTORY:   Past Medical History  Diagnosis Date  . Thyroid disease   . Hypertension   . Hypercholesteremia   . GERD (gastroesophageal reflux disease)     PAST SURGICAL HISTORY:   Past Surgical History  Procedure Laterality Date  . Foot surgery      SOCIAL HISTORY:   Social History  Substance Use Topics  . Smoking status: Never Smoker   . Smokeless tobacco: Not on file  . Alcohol Use: No    FAMILY HISTORY:  History reviewed. No pertinent family history.  DRUG ALLERGIES:  Allergies no known  allergies  REVIEW OF SYSTEMS:   Review of Systems  Constitutional: Negative for fever, chills and malaise/fatigue.  HENT: Negative for hearing loss and sore throat.   Eyes: Negative for blurred vision and double vision.  Respiratory: Negative for cough, hemoptysis and wheezing.   Cardiovascular: Positive for chest pain. Negative for palpitations, orthopnea, claudication, leg swelling and PND.  Gastrointestinal: Positive for nausea, vomiting and diarrhea. Negative for heartburn, constipation, blood in stool and melena.  Neurological: Positive for dizziness, loss of consciousness and headaches.  Psychiatric/Behavioral: The patient is not nervous/anxious.     MEDICATIONS AT HOME:   Prior to Admission medications   Not on File      VITAL SIGNS:  Blood pressure 114/48, pulse 67, resp. rate 16, SpO2 97 %.  PHYSICAL EXAMINATION:  Physical Exam  GENERAL:  79 y.o.-year-old patient lying in the bed with no acute distress.  EYES: Pupils equal, round, reactive to light and accommodation. No scleral icterus. Extraocular muscles intact.  HEENT: Head atraumatic, normocephalic. Oropharynx and nasopharynx clear.  NECK:  Supple, no jugular venous distention. No thyroid enlargement, no tenderness.  LUNGS: Normal breath sounds bilaterally, no wheezing, rales,rhonchi or crepitation. No use of accessory muscles of respiration.  CARDIOVASCULAR: S1, S2 normal. No murmurs, rubs, or gallops.  ABDOMEN: Soft, nontender, nondistended. Bowel sounds present and hyperactive. No organomegaly or mass was palpated.  EXTREMITIES: No pedal edema, cyanosis, or clubbing.  NEUROLOGIC: Cranial nerves II through XII are intact. Muscle strength 5/5 in all extremities.  Sensation intact. Gait not checked.  PSYCHIATRIC: The patient is alert and oriented x 3.  SKIN: No obvious rash, lesion, or ulcer.   LABORATORY PANEL:   CBC  Recent Labs Lab 05/05/15 1552  WBC 10.9  HGB 12.9  HCT 38.8  PLT 157    ------------------------------------------------------------------------------------------------------------------  Chemistries   Recent Labs Lab 05/05/15 1551  NA 142  K 4.0  CL 106  CO2 28  GLUCOSE 109*  BUN 29*  CREATININE 0.83  CALCIUM 9.2  MG 2.0  AST 20  ALT 13*  ALKPHOS 63  BILITOT 0.6   ------------------------------------------------------------------------------------------------------------------  Cardiac Enzymes  Recent Labs Lab 05/05/15 1551  TROPONINI <0.03   ------------------------------------------------------------------------------------------------------------------  RADIOLOGY:  Ct Head Wo Contrast  05/05/2015  CLINICAL DATA:  Nausea this morning, dizziness, headache and chest pain. EXAM: CT HEAD WITHOUT CONTRAST TECHNIQUE: Contiguous axial images were obtained from the base of the skull through the vertex without intravenous contrast. COMPARISON:  Head CT dated 09/27/2009. FINDINGS: Again noted is mild generalized brain atrophy with commensurate dilatation of the ventricles and sulci. Mild chronic small vessel ischemic changes again noted within the periventricular white matter regions bilaterally. 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 There is no mass, hemorrhage, edema, or other evidence of acute parenchymal abnormality. No extra-axial hemorrhage. No acute osseous abnormality. Visualized upper paranasal sinuses are clear. Mastoid air cells are clear. Superficial soft tissues are unremarkable. IMPRESSION: 1. No evidence of acute intracranial abnormality. No intracranial mass, hemorrhage, or edema. 2. Mild atrophy and chronic ischemic changes as detailed above. Electronically Signed   By: Franki Cabot M.D.   On: 05/05/2015 14:54      IMPRESSION AND PLAN:   Patient Active Problem List   Diagnosis Date Noted  . Chest pain I doubt that this patient has cardiac chest pain, since the symptoms  seem to be more of a GI pathology than cardiovascular. Over the past 2-3 years, the patient had an angiogram with no significant occlusion and a stress test which was negative. Admit to telemetry for cardiac monitoring. Trend troponin levels. The patient is chest pain-free at the moment, but will order when necessary nitroglycerin, oxygen and morphine.  Continue beta blocker and ACE inhibitor.. Continue 81 mg aspirin daily. Patient to follow-up with cardiology as an outpatient.  05/05/2015  . Vasovagal syncope Treatment as above. Continue cardiac monitoring.  05/05/2015  . Gastroenteritis No further symptoms at this time. Gentle hydration overnight.  05/05/2015  . Hypertension Continue lisinopril, Bisoprolol/HCT Monitor blood pressure.  05/05/2015  . Hypothyroidism Continue levothyroxine. Monitor TSH periodically  05/05/2015  . Hyperlipidemia Continue simvastatin with periodic LFTs monitoring.  05/05/2015       All the records are reviewed and case discussed with ED provider. Management plans discussed with the patient, family and they are in agreement.  CODE STATUS: Full code.  TOTAL TIME TAKING CARE OF THIS PATIENT: Over 70 minutes were spent during the process of this admission including direct contact with the patient, review of chart, admission orders and documentation.   Reubin Milan M.D on 05/05/2015 at 6:47 PM  Between 7am to 6pm - Pager - [phone]  After 6pm go to [email or web address] - password [email or web address] [facility]  [place] [facility]  Office  [phone]  CC: Primary care physician; No primary care provider on file.

## 2015-05-05 NOTE — ED Notes (Signed)
Pt states she was feeling nauseated this am, pt was walking and started getting dizzy sat down because she felt as if she was going to pass out. Daughter called 911 when ems got there pt c/o headache and chest pain

## 2015-05-06 DIAGNOSIS — R55 Syncope and collapse: Secondary | ICD-10-CM | POA: Diagnosis not present

## 2015-05-06 LAB — TROPONIN I

## 2015-05-06 MED ORDER — PROMETHAZINE HCL 12.5 MG PO TABS: 12.5000 mg | ORAL_TABLET | Freq: Four times a day (QID) | ORAL | Status: DC | PRN

## 2015-05-06 NOTE — Discharge Summary (Signed)
[facility] NAME: Janet Spears    MR#:  [id number]  DATE OF BIRTH:  1936/01/20  DATE OF ADMISSION:  05/05/2015 ADMITTING PHYSICIAN: Reubin Milan, MD  DATE OF DISCHARGE: 05/06/2015  PRIMARY CARE PHYSICIAN: No primary care provider on file.    ADMISSION DIAGNOSIS:  Chest pain, unspecified chest pain type [R07.9] Syncope, unspecified syncope type [R55]  DISCHARGE DIAGNOSIS:  Chest pain Acute gastroenteritis Syncope  SECONDARY DIAGNOSIS:   Past Medical History  Diagnosis Date  . Thyroid disease   . Hypertension   . Hypercholesteremia   . GERD (gastroesophageal reflux disease)     HOSPITAL COURSE:   1. Chest pain probably GI related  Ruled out acute MI with 3 negative cardiac enzymes. Over the past 2-3 years, the patient had an angiogram with no significant occlusion and a stress test which was negative. Patient is asymptomatic during my examination Resume patient's home medication aspirin, bisoprolol, lisinopril and statin Outpatient follow-up with cardiology Is recommended  2. Acute gastroenteritis probably viral Resolved with some to medical treatment  3. Syncope probably from dehydration from problem #2 Improved clinically. Denies any dizziness   4. Hypertension Continue patient's home medication lisinopril, bisoprolol with hydrochlorothiazide   5. Hypothyroidism Continue home medications and thyroid     DISCHARGE CONDITIONS:   Fair  CONSULTS OBTAINED:      PROCEDURES none   DRUG ALLERGIES:  No Known Allergies  DISCHARGE MEDICATIONS:   Current Discharge Medication List    START taking these medications   Details  promethazine (PHENERGAN) 12.5 MG tablet Take 1 tablet (12.5 mg total) by mouth every 6 (six) hours as needed for nausea or vomiting. Qty: 30 tablet, Refills: 0      CONTINUE these medications which have NOT CHANGED   Details  acetaminophen (RA ACETAMINOPHEN)  650 MG CR tablet Take 650 mg by mouth daily.    aspirin EC 81 MG tablet Take 81 mg by mouth at bedtime.    bisoprolol-hydrochlorothiazide (ZIAC) 5-6.25 MG tablet Take 1 tablet by mouth daily. Refills: 3    Cholecalciferol (VITAMIN D-3 PO) Take 1 tablet by mouth every morning.    levothyroxine (SYNTHROID, LEVOTHROID) 100 MCG tablet Take 100 mcg by mouth daily before breakfast. Refills: 2    lisinopril (PRINIVIL,ZESTRIL) 5 MG tablet Take 5 mg by mouth every morning. Refills: 3    meclizine (ANTIVERT) 12.5 MG tablet Take 12.5 mg by mouth daily as needed for dizziness.    Omega-3 Fatty Acids (FISH OIL) 1000 MG CAPS Take 1,000 mg by mouth 2 (two) times daily.    pantoprazole (PROTONIX) 40 MG tablet Take 40 mg by mouth every morning. Refills: 3    polyethylene glycol powder (GLYCOLAX/MIRALAX) powder Take 17 g by mouth daily. Mix in 8 oz of fluid and drink.    primidone (MYSOLINE) 50 MG tablet Take 50 mg by mouth 2 (two) times daily. Refills: 0    scopolamine (TRANSDERM-SCOP) 1 MG/3DAYS Place 1 patch onto the skin every three (3) days as needed. For cruise.    simvastatin (ZOCOR) 80 MG tablet Take 80 mg by mouth at bedtime. Refills: 3    vitamin B-12 (CYANOCOBALAMIN) 1000 MCG tablet Take 1,000 mcg by mouth daily.    vitamin E 1000 UNIT capsule Take 1,000 Units by mouth daily.         DISCHARGE INSTRUCTIONS:   Activity as tolerated  Diet - low salt, low fat F/u with PCP in a  week, pcp to f/u on urine cx - pending  follow-up with cardiology in 1-2 weeks    DIET:  Low-salt   DISCHARGE CONDITION:  Fair  ACTIVITY:  Activity as tolerated  OXYGEN:  Home Oxygen: No.   Oxygen Delivery: room air  DISCHARGE LOCATION:  home   If you experience worsening of your admission symptoms, develop shortness of breath, life threatening emergency, suicidal or homicidal thoughts you must seek medical attention immediately by calling 911 or calling your MD immediately  if symptoms  less severe.  You Must read complete instructions/literature along with all the possible adverse reactions/side effects for all the Medicines you take and that have been prescribed to you. Take any new Medicines after you have completely understood and accpet all the possible adverse reactions/side effects.   Please note  You were cared for by a hospitalist during your hospital stay. If you have any questions about your discharge medications or the care you received while you were in the hospital after you are discharged, you can call the unit and asked to speak with the hospitalist on call if the hospitalist that took care of you is not available. Once you are discharged, your primary care physician will handle any further medical issues. Please note that NO REFILLS for any discharge medications will be authorized once you are discharged, as it is imperative that you return to your primary care physician (or establish a relationship with a primary care physician if you do not have one) for your aftercare needs so that they can reassess your need for medications and monitor your lab values.     Today  Chief Complaint  Patient presents with  . Loss of Consciousness  . Nausea   Patient is resting comfortably during my examination. Denies any chest pain or shortness of breath. Feeling much better and ambulating in the hallway. Wants to be discharged home  ROS:  CONSTITUTIONAL: Denies fevers, chills. Denies any fatigue, weakness.  EYES: Denies blurry vision, double vision, eye pain. EARS, NOSE, THROAT: Denies tinnitus, ear pain, hearing loss. RESPIRATORY: Denies cough, wheeze, shortness of breath.  CARDIOVASCULAR: Denies chest pain, palpitations, edema.  GASTROINTESTINAL: Denies nausea, vomiting, diarrhea, abdominal pain. Denies bright red blood per rectum. GENITOURINARY: Denies dysuria, hematuria. ENDOCRINE: Denies nocturia or thyroid problems. HEMATOLOGIC AND LYMPHATIC: Denies easy bruising  or bleeding. SKIN: Denies rash or lesion. MUSCULOSKELETAL: Denies pain in neck, back, shoulder, knees, hips or arthritic symptoms.  NEUROLOGIC: Denies paralysis, paresthesias.  PSYCHIATRIC: Denies anxiety or depressive symptoms.   VITAL SIGNS:  Blood pressure 120/62, pulse 89, temperature 97.7 F (36.5 C), temperature source Oral, resp. rate 16, weight 64.728 kg (142 lb 11.2 oz), SpO2 96 %.  I/O:   Intake/Output Summary (Last 24 hours) at 05/06/15 1000 Last data filed at 05/06/15 0941  Gross per 24 hour  Intake    120 ml  Output      0 ml  Net    120 ml    PHYSICAL EXAMINATION:  GENERAL:  79 y.o.-year-old patient lying in the bed with no acute distress.  EYES: Pupils equal, round, reactive to light and accommodation. No scleral icterus. Extraocular muscles intact.  HEENT: Head atraumatic, normocephalic. Oropharynx and nasopharynx clear.  NECK:  Supple, no jugular venous distention. No thyroid enlargement, no tenderness.  LUNGS: Normal breath sounds bilaterally, no wheezing, rales,rhonchi or crepitation. No use of accessory muscles of respiration.  CARDIOVASCULAR: S1, S2 normal. No murmurs, rubs, or gallops.  ABDOMEN: Soft, non-tender, non-distended. Bowel sounds  present. No organomegaly or mass.  EXTREMITIES: No pedal edema, cyanosis, or clubbing.  NEUROLOGIC: Cranial nerves II through XII are intact. Muscle strength 5/5 in all extremities. Sensation intact. Gait not checked.  PSYCHIATRIC: The patient is alert and oriented x 3.  SKIN: No obvious rash, lesion, or ulcer.   DATA REVIEW:   CBC  Recent Labs Lab 05/05/15 1552  WBC 10.9  HGB 12.9  HCT 38.8  PLT 157    Chemistries   Recent Labs Lab 05/05/15 1551  NA 142  K 4.0  CL 106  CO2 28  GLUCOSE 109*  BUN 29*  CREATININE 0.83  CALCIUM 9.2  MG 2.0  AST 20  ALT 13*  ALKPHOS 63  BILITOT 0.6    Cardiac Enzymes  Recent Labs Lab 05/06/15 0502  TROPONINI <0.03    Microbiology Results  No results  found for this or any previous visit.  RADIOLOGY:  Ct Head Wo Contrast  05/05/2015  CLINICAL DATA:  Nausea this morning, dizziness, headache and chest pain. EXAM: CT HEAD WITHOUT CONTRAST TECHNIQUE: Contiguous axial images were obtained from the base of the skull through the vertex without intravenous contrast. COMPARISON:  Head CT dated 09/27/2009. FINDINGS: Again noted is mild generalized brain atrophy with commensurate dilatation of the ventricles and sulci. Mild chronic small vessel ischemic changes again noted within the periventricular white matter regions bilaterally. 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 There is no mass, hemorrhage, edema, or other evidence of acute parenchymal abnormality. No extra-axial hemorrhage. No acute osseous abnormality. Visualized upper paranasal sinuses are clear. Mastoid air cells are clear. Superficial soft tissues are unremarkable. IMPRESSION: 1. No evidence of acute intracranial abnormality. No intracranial mass, hemorrhage, or edema. 2. Mild atrophy and chronic ischemic changes as detailed above. Electronically Signed   By: Franki Cabot M.D.   On: 05/05/2015 14:54    EKG:   Orders placed or performed in visit on 08/03/14  . EKG 12-Lead      Management plans discussed with the patient, family and they are in agreement.  CODE STATUS:     Code Status Orders        Start     Ordered   05/05/15 1935  Full code   Continuous     05/05/15 1934    Advance Directive Documentation        Most Recent Value   Type of Advance Directive  Healthcare Power of Attorney   Pre-existing out of facility DNR order (yellow form or pink MOST form)     "MOST" Form in Place?        TOTAL TIME TAKING CARE OF THIS PATIENT: 42 minutes.    @[facility] @  on 05/06/2015 at 10:00 AM  Between 7am to 6pm - Pager - [phone]  After 6pm go to [email or web address] - password [email or web address] [facility]  [facility]  Office   [phone]  CC: Primary care physician; No primary care provider on file.

## 2015-05-06 NOTE — Discharge Instructions (Signed)
Activity as tolerated  Diet - low salt, low fat F/u with PCP in a week, pcp to f/u on urine cx - pending

## 2015-05-06 NOTE — Progress Notes (Signed)
Pt is a&o, VSS, NSR on tele with no complaints of pain or discomfort just eager to go home. Order to d/c pt to home. Discharge instructions and prescription given to pt and daughter with verbal acknowledgment of understanding. IV and tele removed and pt escorted off unit via wheelchair by nursing.

## 2015-05-07 LAB — URINE CULTURE

## 2015-06-01 ENCOUNTER — Encounter: Payer: Self-pay | Admitting: *Deleted

## 2015-06-04 ENCOUNTER — Encounter: Payer: Self-pay | Admitting: Anesthesiology

## 2015-06-04 ENCOUNTER — Ambulatory Visit: Payer: Medicare Other | Admitting: Certified Registered Nurse Anesthetist

## 2015-06-04 ENCOUNTER — Ambulatory Visit
Admission: RE | Admit: 2015-06-04 | Discharge: 2015-06-04 | Disposition: A | Discharge status: Home / Self Care | Payer: Medicare Other | Source: Ambulatory Visit | Attending: Gastroenterology | Admitting: Gastroenterology

## 2015-06-04 ENCOUNTER — Encounter
Admission: RE | Disposition: A | Discharge status: Home / Self Care | Payer: Self-pay | Source: Ambulatory Visit | Attending: Gastroenterology

## 2015-06-04 DIAGNOSIS — Z808 Family history of malignant neoplasm of other organs or systems: Secondary | ICD-10-CM | POA: Insufficient documentation

## 2015-06-04 DIAGNOSIS — E559 Vitamin D deficiency, unspecified: Secondary | ICD-10-CM | POA: Insufficient documentation

## 2015-06-04 DIAGNOSIS — Z803 Family history of malignant neoplasm of breast: Secondary | ICD-10-CM | POA: Insufficient documentation

## 2015-06-04 DIAGNOSIS — Z8249 Family history of ischemic heart disease and other diseases of the circulatory system: Secondary | ICD-10-CM | POA: Diagnosis not present

## 2015-06-04 DIAGNOSIS — Z823 Family history of stroke: Secondary | ICD-10-CM | POA: Diagnosis not present

## 2015-06-04 DIAGNOSIS — I10 Essential (primary) hypertension: Secondary | ICD-10-CM | POA: Diagnosis not present

## 2015-06-04 DIAGNOSIS — D123 Benign neoplasm of transverse colon: Secondary | ICD-10-CM | POA: Diagnosis not present

## 2015-06-04 DIAGNOSIS — Z8673 Personal history of transient ischemic attack (TIA), and cerebral infarction without residual deficits: Secondary | ICD-10-CM | POA: Insufficient documentation

## 2015-06-04 DIAGNOSIS — K219 Gastro-esophageal reflux disease without esophagitis: Secondary | ICD-10-CM | POA: Diagnosis not present

## 2015-06-04 DIAGNOSIS — Z79899 Other long term (current) drug therapy: Secondary | ICD-10-CM | POA: Diagnosis not present

## 2015-06-04 DIAGNOSIS — R32 Unspecified urinary incontinence: Secondary | ICD-10-CM | POA: Insufficient documentation

## 2015-06-04 DIAGNOSIS — Z90710 Acquired absence of both cervix and uterus: Secondary | ICD-10-CM | POA: Diagnosis not present

## 2015-06-04 DIAGNOSIS — I2510 Atherosclerotic heart disease of native coronary artery without angina pectoris: Secondary | ICD-10-CM | POA: Diagnosis not present

## 2015-06-04 DIAGNOSIS — E079 Disorder of thyroid, unspecified: Secondary | ICD-10-CM | POA: Insufficient documentation

## 2015-06-04 DIAGNOSIS — K5730 Diverticulosis of large intestine without perforation or abscess without bleeding: Secondary | ICD-10-CM | POA: Insufficient documentation

## 2015-06-04 DIAGNOSIS — Z86010 Personal history of colonic polyps: Secondary | ICD-10-CM | POA: Insufficient documentation

## 2015-06-04 HISTORY — PX: COLONOSCOPY WITH PROPOFOL: SHX5780

## 2015-06-04 SURGERY — COLONOSCOPY WITH PROPOFOL
Anesthesia: General

## 2015-06-04 MED ORDER — SODIUM CHLORIDE 0.9 % IV SOLN
INTRAVENOUS | Status: DC
  Administered 2015-06-04: 1000 mL via INTRAVENOUS

## 2015-06-04 MED ORDER — LIDOCAINE HCL (CARDIAC) 20 MG/ML IV SOLN
INTRAVENOUS | Status: DC | PRN
  Administered 2015-06-04: 60 mg via INTRAVENOUS

## 2015-06-04 MED ORDER — PROPOFOL 10 MG/ML IV BOLUS
INTRAVENOUS | Status: DC | PRN
  Administered 2015-06-04: 30 mg via INTRAVENOUS

## 2015-06-04 MED ORDER — MIDAZOLAM HCL 2 MG/2ML IJ SOLN
INTRAMUSCULAR | Status: DC | PRN
  Administered 2015-06-04: 1 mg via INTRAVENOUS

## 2015-06-04 MED ORDER — PROPOFOL 500 MG/50ML IV EMUL
INTRAVENOUS | Status: DC | PRN
  Administered 2015-06-04: 100 ug/kg/min via INTRAVENOUS

## 2015-06-04 MED ORDER — SODIUM CHLORIDE 0.9 % IV SOLN: INTRAVENOUS | Status: DC

## 2015-06-04 NOTE — Anesthesia Preprocedure Evaluation (Signed)
Anesthesia Evaluation  Patient identified by MRN, date of birth, ID band Patient awake    Reviewed: Allergy & Precautions, H&P , NPO status , Patient's Chart, lab work & pertinent test results  History of Anesthesia Complications Negative for: history of anesthetic complications  Airway Mallampati: III  TM Distance: <3 FB Neck ROM: limited    Dental  (+) Poor Dentition, Chipped   Pulmonary neg pulmonary ROS, neg shortness of breath,    Pulmonary exam normal breath sounds clear to auscultation       Cardiovascular Exercise Tolerance: Good hypertension, (-) angina(-) Past MI and (-) DOE Normal cardiovascular exam Rhythm:regular Rate:Normal     Neuro/Psych negative neurological ROS  negative psych ROS   GI/Hepatic Neg liver ROS, GERD  Controlled,  Endo/Other  Hypothyroidism   Renal/GU negative Renal ROS  negative genitourinary   Musculoskeletal   Abdominal   Peds  Hematology negative hematology ROS (+)   Anesthesia Other Findings Past Medical History:   Thyroid disease                                              Hypertension                                                 Hypercholesteremia                                           GERD (gastroesophageal reflux disease)                      Past Surgical History:   FOOT SURGERY                                                 BMI    Body Mass Index   25.23 kg/m 2      Reproductive/Obstetrics negative OB ROS                             Anesthesia Physical Anesthesia Plan  ASA: III  Anesthesia Plan: General   Post-op Pain Management:    Induction:   Airway Management Planned:   Additional Equipment:   Intra-op Plan:   Post-operative Plan:   Informed Consent: I have reviewed the patients History and Physical, chart, labs and discussed the procedure including the risks, benefits and alternatives for the proposed  anesthesia with the patient or authorized representative who has indicated his/her understanding and acceptance.   Dental Advisory Given  Plan Discussed with: Anesthesiologist, CRNA and Surgeon  Anesthesia Plan Comments:         Anesthesia Quick Evaluation

## 2015-06-04 NOTE — Op Note (Signed)
[facility] Patient Name: Janet Spears Procedure Date: 06/04/2015 8:58 AM MRN: [id number] Account #: [id number] Date of Birth: 09-Aug-1935 Admit Type: Outpatient Age: 79 Room: [facility] ROOM 4 Gender: Female Note Status: Finalized Procedure:         Colonoscopy Indications:       Personal history of colonic polyps Providers:         Lupita Dawn. Candace Cruise, MD Referring MD:      Sofie Hartigan (Referring MD) Medicines:         Monitored Anesthesia Care Complications:     No immediate complications. Procedure:         Pre-Anesthesia Assessment:                    - Prior to the procedure, a History and Physical was                     performed, and patient medications, allergies and                     sensitivities were reviewed. The patient's tolerance of                     previous anesthesia was reviewed.                    - The risks and benefits of the procedure and the sedation                     options and risks were discussed with the patient. All                     questions were answered and informed consent was obtained.                    - After reviewing the risks and benefits, the patient was                     deemed in satisfactory condition to undergo the procedure.                    After obtaining informed consent, the colonoscope was                     passed under direct vision. Throughout the procedure, the                     patient's blood pressure, pulse, and oxygen saturations                     were monitored continuously. The Colonoscope was                     introduced through the anus and advanced to the the cecum,                     identified by appendiceal orifice and ileocecal valve. The                     colonoscopy was performed without difficulty. The patient                     tolerated the procedure well. The quality of the bowel  preparation was fair. Findings:      A few  small-mouthed diverticula were found in the sigmoid colon.      A small polyp was found in the transverse colon in the proximal       transverse colon. The polyp was sessile. The polyp was removed with a       cold snare. Resection and retrieval were complete.      The exam was otherwise without abnormality. Impression:        - Diverticulosis in the sigmoid colon.                    - One small polyp in the transverse colon in the proximal                     transverse colon. Resected and retrieved.                    - The examination was otherwise normal. Recommendation:    - Discharge patient to home.                    - Await pathology results.                    - The findings and recommendations were discussed with the                     patient. Procedure Code(s): --- Professional ---                    [id number], Colonoscopy, flexible; with removal of tumor(s),                     polyp(s), or other lesion(s) by snare technique Diagnosis Code(s): --- Professional ---                    D12.3, Benign neoplasm of transverse colon                    K57.30, Diverticulosis of large intestine without                     perforation or abscess without bleeding                    Z86.010, Personal history of colonic polyps CPT copyright 2014 American Medical Association. All rights reserved. The codes documented in this report are preliminary and upon coder review may  be revised to meet current compliance requirements. Hulen Luster, MD 06/04/2015 9:15:21 AM This report has been signed electronically. Number of Addenda: 0 Note Initiated On: 06/04/2015 8:58 AM Scope Withdrawal Time: 0 hours 6 minutes 21 seconds  Total Procedure Duration: 0 hours 9 minutes 46 seconds       [facility]

## 2015-06-04 NOTE — Anesthesia Postprocedure Evaluation (Signed)
Anesthesia Post Note  Patient: Janet Spears  Procedure(s) Performed: Procedure(s) (LRB): COLONOSCOPY WITH PROPOFOL (N/A)  Patient location during evaluation: Endoscopy Anesthesia Type: General Level of consciousness: awake and alert Pain management: pain level controlled Vital Signs Assessment: post-procedure vital signs reviewed and stable Respiratory status: spontaneous breathing, nonlabored ventilation, respiratory function stable and patient connected to nasal cannula oxygen Cardiovascular status: blood pressure returned to baseline and stable Postop Assessment: no signs of nausea or vomiting Anesthetic complications: no    Last Vitals:  Filed Vitals:   06/04/15 0940 06/04/15 0950  BP: 101/83 137/62  Pulse: 59 59  Temp:    Resp: 15 14    Last Pain: There were no vitals filed for this visit.               Precious Haws Kimerly Rowand

## 2015-06-04 NOTE — H&P (Addendum)
  Date of Initial H&P: 05/25/2015  History reviewed, patient examined, no change in status, stable for surgery.

## 2015-06-04 NOTE — Transfer of Care (Signed)
Immediate Anesthesia Transfer of Care Note  Patient: Janet Spears  Procedure(s) Performed: Procedure(s): COLONOSCOPY WITH PROPOFOL (N/A)  Patient Location: PACU  Anesthesia Type:General  Level of Consciousness: sedated  Airway & Oxygen Therapy: Patient Spontanous Breathing and Patient connected to nasal cannula oxygen  Post-op Assessment: Report given to RN and Post -op Vital signs reviewed and stable  Post vital signs: Reviewed and stable  Last Vitals:  Filed Vitals:   06/04/15 0828  BP: 146/65  Pulse: 65  Temp: 36.5 C  Resp: 16    Complications: No apparent anesthesia complications

## 2015-06-04 NOTE — Anesthesia Procedure Notes (Signed)
Date/Time: 06/04/2015 8:57 AM Performed by: Johnna Acosta Pre-anesthesia Checklist: Patient identified, Emergency Drugs available, Suction available, Patient being monitored and Timeout performed Patient Re-evaluated:Patient Re-evaluated prior to inductionOxygen Delivery Method: Nasal cannula

## 2015-06-05 LAB — SURGICAL PATHOLOGY

## 2015-06-06 ENCOUNTER — Encounter: Payer: Self-pay | Admitting: Gastroenterology

## 2015-09-02 ENCOUNTER — Ambulatory Visit
Admission: EM | Admit: 2015-09-02 | Discharge: 2015-09-02 | Disposition: A | Discharge status: Home / Self Care | Payer: Medicare Other | Attending: Family Medicine | Admitting: Family Medicine

## 2015-09-02 ENCOUNTER — Encounter: Payer: Self-pay | Admitting: Gynecology

## 2015-09-02 DIAGNOSIS — K219 Gastro-esophageal reflux disease without esophagitis: Secondary | ICD-10-CM | POA: Diagnosis not present

## 2015-09-02 DIAGNOSIS — J111 Influenza due to unidentified influenza virus with other respiratory manifestations: Secondary | ICD-10-CM

## 2015-09-02 DIAGNOSIS — I10 Essential (primary) hypertension: Secondary | ICD-10-CM | POA: Diagnosis not present

## 2015-09-02 DIAGNOSIS — R05 Cough: Secondary | ICD-10-CM | POA: Diagnosis present

## 2015-09-02 DIAGNOSIS — R6889 Other general symptoms and signs: Secondary | ICD-10-CM

## 2015-09-02 DIAGNOSIS — I519 Heart disease, unspecified: Secondary | ICD-10-CM | POA: Diagnosis not present

## 2015-09-02 DIAGNOSIS — J0100 Acute maxillary sinusitis, unspecified: Secondary | ICD-10-CM | POA: Diagnosis not present

## 2015-09-02 DIAGNOSIS — E079 Disorder of thyroid, unspecified: Secondary | ICD-10-CM | POA: Diagnosis not present

## 2015-09-02 DIAGNOSIS — E7800 Pure hypercholesterolemia, unspecified: Secondary | ICD-10-CM | POA: Insufficient documentation

## 2015-09-02 HISTORY — DX: Heart disease, unspecified: I51.9

## 2015-09-02 LAB — RAPID INFLUENZA A&B ANTIGENS: Influenza B (ARMC): NEGATIVE

## 2015-09-02 LAB — RAPID INFLUENZA A&B ANTIGENS (ARMC ONLY): INFLUENZA A (ARMC): NEGATIVE

## 2015-09-02 MED ORDER — OSELTAMIVIR PHOSPHATE 75 MG PO CAPS: 75.0000 mg | ORAL_CAPSULE | Freq: Two times a day (BID) | ORAL | Status: DC

## 2015-09-02 MED ORDER — FEXOFENADINE-PSEUDOEPHED ER 180-240 MG PO TB24: 1.0000 | ORAL_TABLET | Freq: Every day | ORAL | Status: DC

## 2015-09-02 MED ORDER — HYDROCOD POLST-CPM POLST ER 10-8 MG/5ML PO SUER: 5.0000 mL | Freq: Two times a day (BID) | ORAL | Status: DC | PRN

## 2015-09-02 MED ORDER — ACETAMINOPHEN 500 MG PO TABS
500.0000 mg | ORAL_TABLET | Freq: Once | ORAL | Status: AC
  Administered 2015-09-02: 500 mg via ORAL

## 2015-09-02 MED ORDER — AMOXICILLIN-POT CLAVULANATE 875-125 MG PO TABS: 1.0000 | ORAL_TABLET | Freq: Two times a day (BID) | ORAL | Status: DC

## 2015-09-02 NOTE — Discharge Instructions (Signed)
Influenza, Adult Influenza (flu) is an infection in the mouth, nose, and throat (respiratory tract) caused by a virus. The flu can make you feel very ill. Influenza spreads easily from person to person (contagious).  HOME CARE   Only take medicines as told by your doctor.  Use a cool mist humidifier to make breathing easier.  Get plenty of rest until your fever goes away. This usually takes 3 to 4 days.  Drink enough fluids to keep your pee (urine) clear or pale yellow.  Cover your mouth and nose when you cough or sneeze.  Wash your hands well to avoid spreading the flu.  Stay home from work or school until your fever has been gone for at least 1 full day.  Get a flu shot every year. GET HELP RIGHT AWAY IF:   You have trouble breathing or feel short of breath.  Your skin or nails turn blue.  You have severe neck pain or stiffness.  You have a severe headache, facial pain, or earache.  Your fever gets worse or keeps coming back.  You feel sick to your stomach (nauseous), throw up (vomit), or have watery poop (diarrhea).  You have chest pain.  You have a deep cough that gets worse, or you cough up more thick spit (mucus). MAKE SURE YOU:   Understand these instructions.  Will watch your condition.  Will get help right away if you are not doing well or get worse.   This information is not intended to replace advice given to you by your health care provider. Make sure you discuss any questions you have with your health care provider.   Document Released: 03/18/2008 Document Revised: 06/30/2014 Document Reviewed: 09/08/2011 Elsevier Interactive Patient Education 2016 [employer].  Sinusitis, Adult Sinusitis is redness, soreness, and puffiness (inflammation) of the air pockets in the bones of your face (sinuses). The redness, soreness, and puffiness can cause air and mucus to get trapped in your sinuses. This can allow germs to grow and cause an infection.  HOME CARE    Drink enough fluids to keep your pee (urine) clear or pale yellow.  Use a humidifier in your home.  Run a hot shower to create steam in the bathroom. Sit in the bathroom with the door closed. Breathe in the steam 3-4 times a day.  Put a warm, moist washcloth on your face 3-4 times a day, or as told by your doctor.  Use salt water sprays (saline sprays) to wet the thick fluid in your nose. This can help the sinuses drain.  Only take medicine as told by your doctor. GET HELP RIGHT AWAY IF:   Your pain gets worse.  You have very bad headaches.  You are sick to your stomach (nauseous).  You throw up (vomit).  You are very sleepy (drowsy) all the time.  Your face is puffy (swollen).  Your vision changes.  You have a stiff neck.  You have trouble breathing. MAKE SURE YOU:   Understand these instructions.  Will watch your condition.  Will get help right away if you are not doing well or get worse.   This information is not intended to replace advice given to you by your health care provider. Make sure you discuss any questions you have with your health care provider.   Document Released: 11/26/2007 Document Revised: 06/30/2014 Document Reviewed: 01/13/2012 Elsevier Interactive Patient Education [employer].

## 2015-09-02 NOTE — ED Provider Notes (Signed)
CSN: [id number]     Arrival date & time 09/02/15  1051 History   First MD Initiated Contact with Patient 09/02/15 1336    Nurse's note  Chief Complaint  Patient presents with  . Chills  . Cough   Patient's here because of nasal congestion coughing. She states that over week she's had nasal congestion running nose and which she thought was sinus problems. She she states that the sinus problem was getting worse but then yesterday she started running a fever was aching all over sore throat and coughing more. She felt like this morning she had overtime he flew and came in to be seen. She is running a fever now as well.  (Consider location/radiation/quality/duration/timing/severity/associated sxs/prior Treatment) Patient is a 80 y.o. female presenting with URI. The history is provided by the patient. No language interpreter was used.  URI Presenting symptoms: congestion, cough, facial pain, fever and rhinorrhea   Severity:  Moderate Duration:  1 week (Sinus congestion going on for week the fever and aching less than 24 hours) Timing:  Constant Progression:  Worsening Chronicity:  New Relieved by:  Nothing Worsened by:  Breathing Ineffective treatments:  None tried Associated symptoms: myalgias   Risk factors: being elderly     Past Medical History  Diagnosis Date  . Thyroid disease   . Hypertension   . Hypercholesteremia   . GERD (gastroesophageal reflux disease)   . Heart disease    Past Surgical History  Procedure Laterality Date  . Foot surgery    . Colonoscopy with propofol N/A 06/04/2015    Procedure: COLONOSCOPY WITH PROPOFOL;  Surgeon: Hulen Luster, MD;  Location: [facility] ENDOSCOPY;  Service: Gastroenterology;  Laterality: N/A;   No family history on file. Social History  Substance Use Topics  . Smoking status: Never Smoker   . Smokeless tobacco: None  . Alcohol Use: No   OB History    No data available     Review of Systems  Constitutional: Positive for fever.   HENT: Positive for congestion and rhinorrhea.   Respiratory: Positive for cough.   Musculoskeletal: Positive for myalgias.  All other systems reviewed and are negative.   Allergies  Review of patient's allergies indicates no known allergies.  Home Medications   Prior to Admission medications   Medication Sig Start Date End Date Taking? Authorizing Provider  acetaminophen (RA ACETAMINOPHEN) 650 MG CR tablet Take 650 mg by mouth daily.   Yes Historical Provider, MD  aspirin EC 81 MG tablet Take 81 mg by mouth at bedtime.   Yes Historical Provider, MD  bisoprolol-hydrochlorothiazide (ZIAC) 5-6.25 MG tablet Take 1 tablet by mouth daily. 05/02/15  Yes Historical Provider, MD  Cholecalciferol (VITAMIN D-3 PO) Take 1 tablet by mouth every morning.   Yes Historical Provider, MD  levothyroxine (SYNTHROID, LEVOTHROID) 100 MCG tablet Take 100 mcg by mouth daily before breakfast. 05/02/15  Yes Historical Provider, MD  lisinopril (PRINIVIL,ZESTRIL) 5 MG tablet Take 5 mg by mouth every morning. 05/02/15  Yes Historical Provider, MD  meclizine (ANTIVERT) 12.5 MG tablet Take 12.5 mg by mouth daily as needed for dizziness.   Yes Historical Provider, MD  Omega-3 Fatty Acids (FISH OIL) 1000 MG CAPS Take 1,000 mg by mouth 2 (two) times daily.   Yes Historical Provider, MD  pantoprazole (PROTONIX) 40 MG tablet Take 40 mg by mouth every morning. 05/02/15  Yes Historical Provider, MD  polyethylene glycol powder (GLYCOLAX/MIRALAX) powder Take 17 g by mouth daily. Mix in 8 oz of  fluid and drink. 03/28/10  Yes Historical Provider, MD  primidone (MYSOLINE) 50 MG tablet Take 50 mg by mouth 2 (two) times daily. 04/21/15  Yes Historical Provider, MD  promethazine (PHENERGAN) 12.5 MG tablet Take 1 tablet (12.5 mg total) by mouth every 6 (six) hours as needed for nausea or vomiting. 05/06/15  Yes Nicholes Mango, MD  simvastatin (ZOCOR) 80 MG tablet Take 80 mg by mouth at bedtime. 05/02/15  Yes Historical Provider, MD  vitamin  B-12 (CYANOCOBALAMIN) 1000 MCG tablet Take 1,000 mcg by mouth daily.   Yes Historical Provider, MD  vitamin E 1000 UNIT capsule Take 1,000 Units by mouth daily.   Yes Historical Provider, MD   Meds Ordered and Administered this Visit   Medications  acetaminophen (TYLENOL) tablet 500 mg (500 mg Oral Given 09/02/15 1333)    BP 146/59 mmHg  Pulse 88  Temp(Src) 102.9 F (39.4 C) (Oral)  Resp 16  Ht 5' (1.524 m)  Wt 138 lb (62.596 kg)  BMI 26.95 kg/m2  SpO2 100% No data found.   Physical Exam  Constitutional: She is oriented to person, place, and time. She appears well-developed and well-nourished. No distress.  HENT:  Head: Normocephalic and atraumatic.  Right Ear: Hearing, tympanic membrane, external ear and ear canal normal.  Left Ear: Hearing, tympanic membrane, external ear and ear canal normal.  Nose: Rhinorrhea present.  Mouth/Throat: She does not have dentures. Normal dentition.  Eyes: Conjunctivae are normal. Pupils are equal, round, and reactive to light.  Neck: Normal range of motion. Neck supple. No tracheal deviation present.  Cardiovascular: Normal rate and regular rhythm.   Pulmonary/Chest: Effort normal and breath sounds normal.  Musculoskeletal: Normal range of motion.  Lymphadenopathy:    She has cervical adenopathy.  Neurological: She is alert and oriented to person, place, and time.  Skin: Skin is warm and dry. She is not diaphoretic. No erythema.  Psychiatric: She has a normal mood and affect.  Vitals reviewed.   ED Course  Procedures (including critical care time)  Labs Review Labs Reviewed  RAPID INFLUENZA A&B ANTIGENS ([place])    Imaging Review No results found.   Visual Acuity Review  Right Eye Distance:   Left Eye Distance:   Bilateral Distance:    Right Eye Near:   Left Eye Near:    Bilateral Near:    Results for orders placed or performed during the hospital encounter of 09/02/15  Rapid Influenza A&B Antigens ([facility] only)   Result Value Ref Range   Influenza A ([facility]) NEGATIVE NEGATIVE   Influenza B ([facility]) NEGATIVE NEGATIVE     MDM   1. Acute maxillary sinusitis, recurrence not specified   2. Influenza   3. Flu-like symptoms   Patient flu test was negative but with the fever and congestion we will going to place her on Tamiflu 1 capsule twice a day Augmentin 875 twice a day, Allegra-D once a day and will also place her on Tussionex 1 teaspoon twice a day. Ask her to contact her PCP if not better next 3-4 days. Explained to patient will treat her for the flu since everything started less than 24 hours as far as fever will treat for sinus infection since she's had nasal congestion and felt bad for over a week and that I think flu joined her sinus infection.  Note: This dictation was prepared with Dragon dictation along with smaller phrase technology. Any transcriptional errors that result from this process are unintentional.    Cornelia Copa  Alveta Heimlich, MD 09/02/15 1425

## 2015-09-02 NOTE — ED Notes (Signed)
Patient c/o chills and cough x yesterday.

## 2015-11-10 ENCOUNTER — Ambulatory Visit
Admission: EM | Admit: 2015-11-10 | Discharge: 2015-11-10 | Disposition: A | Discharge status: Home / Self Care | Payer: Medicare Other | Attending: Family Medicine | Admitting: Family Medicine

## 2015-11-10 ENCOUNTER — Encounter: Payer: Self-pay | Admitting: Emergency Medicine

## 2015-11-10 DIAGNOSIS — N390 Urinary tract infection, site not specified: Secondary | ICD-10-CM | POA: Diagnosis not present

## 2015-11-10 LAB — URINALYSIS COMPLETE WITH MICROSCOPIC (ARMC ONLY)
Bilirubin Urine: NEGATIVE
Glucose, UA: NEGATIVE mg/dL
Hgb urine dipstick: NEGATIVE
Ketones, ur: NEGATIVE mg/dL
Nitrite: NEGATIVE
PROTEIN: NEGATIVE mg/dL
SPECIFIC GRAVITY, URINE: 1.01 (ref 1.005–1.030)
pH: 6 (ref 5.0–8.0)

## 2015-11-10 MED ORDER — NITROFURANTOIN MONOHYD MACRO 100 MG PO CAPS: 100.0000 mg | ORAL_CAPSULE | Freq: Two times a day (BID) | ORAL | Status: DC

## 2015-11-10 NOTE — ED Notes (Signed)
Patient c/o burning when urinating and increase in urinary frequency that started on Wed. Patient denies fevers.  °

## 2015-11-10 NOTE — ED Provider Notes (Signed)
CSN: [id number]     Arrival date & time 11/10/15  1105 History   First MD Initiated Contact with Patient 11/10/15 1122     Chief Complaint  Patient presents with  . Dysuria   (Consider location/radiation/quality/duration/timing/severity/associated sxs/prior Treatment) HPI: Patient presents today with symptoms of dysuria, urinary frequency that started on Wednesday. Patient denies any fever or chills. Patient denies any flank pain or hematuria. She has had 2 other UTIs this past year. She's had 3 vaginal deliveries in the past. She does not have her uterus but does have her ovaries.  Past Medical History  Diagnosis Date  . Thyroid disease   . Hypertension   . Hypercholesteremia   . GERD (gastroesophageal reflux disease)   . Heart disease    Past Surgical History  Procedure Laterality Date  . Foot surgery    . Colonoscopy with propofol N/A 06/04/2015    Procedure: COLONOSCOPY WITH PROPOFOL;  Surgeon: Hulen Luster, MD;  Location: [facility] ENDOSCOPY;  Service: Gastroenterology;  Laterality: N/A;   History reviewed. No pertinent family history. Social History  Substance Use Topics  . Smoking status: Never Smoker   . Smokeless tobacco: None  . Alcohol Use: No   OB History    No data available     Review of Systems: Negative except mentioned above.   Allergies  Review of patient's allergies indicates no known allergies.  Home Medications   Prior to Admission medications   Medication Sig Start Date End Date Taking? Authorizing Provider  acetaminophen (RA ACETAMINOPHEN) 650 MG CR tablet Take 650 mg by mouth daily.    Historical Provider, MD  amoxicillin-clavulanate (AUGMENTIN) 875-125 MG tablet Take 1 tablet by mouth 2 (two) times daily. 09/02/15   Frederich Cha, MD  aspirin EC 81 MG tablet Take 81 mg by mouth at bedtime.    Historical Provider, MD  bisoprolol-hydrochlorothiazide (ZIAC) 5-6.25 MG tablet Take 1 tablet by mouth daily. 05/02/15   Historical Provider, MD   chlorpheniramine-HYDROcodone (TUSSIONEX PENNKINETIC ER) 10-8 MG/5ML SUER Take 5 mLs by mouth every 12 (twelve) hours as needed for cough. 09/02/15   Frederich Cha, MD  Cholecalciferol (VITAMIN D-3 PO) Take 1 tablet by mouth every morning.    Historical Provider, MD  fexofenadine-pseudoephedrine (ALLEGRA-D ALLERGY & CONGESTION) 180-240 MG 24 hr tablet Take 1 tablet by mouth daily. 09/02/15   Frederich Cha, MD  levothyroxine (SYNTHROID, LEVOTHROID) 100 MCG tablet Take 100 mcg by mouth daily before breakfast. 05/02/15   Historical Provider, MD  lisinopril (PRINIVIL,ZESTRIL) 5 MG tablet Take 5 mg by mouth every morning. 05/02/15   Historical Provider, MD  meclizine (ANTIVERT) 12.5 MG tablet Take 12.5 mg by mouth daily as needed for dizziness.    Historical Provider, MD  nitrofurantoin, macrocrystal-monohydrate, (MACROBID) 100 MG capsule Take 1 capsule (100 mg total) by mouth 2 (two) times daily. 11/10/15   Paulina Fusi, MD  Omega-3 Fatty Acids (FISH OIL) 1000 MG CAPS Take 1,000 mg by mouth 2 (two) times daily.    Historical Provider, MD  oseltamivir (TAMIFLU) 75 MG capsule Take 1 capsule (75 mg total) by mouth 2 (two) times daily. 09/02/15   Frederich Cha, MD  pantoprazole (PROTONIX) 40 MG tablet Take 40 mg by mouth every morning. 05/02/15   Historical Provider, MD  polyethylene glycol powder (GLYCOLAX/MIRALAX) powder Take 17 g by mouth daily. Mix in 8 oz of fluid and drink. 03/28/10   Historical Provider, MD  primidone (MYSOLINE) 50 MG tablet Take 50 mg by mouth 2 (two) times  daily. 04/21/15   Historical Provider, MD  promethazine (PHENERGAN) 12.5 MG tablet Take 1 tablet (12.5 mg total) by mouth every 6 (six) hours as needed for nausea or vomiting. 05/06/15   Nicholes Mango, MD  simvastatin (ZOCOR) 80 MG tablet Take 80 mg by mouth at bedtime. 05/02/15   Historical Provider, MD  vitamin B-12 (CYANOCOBALAMIN) 1000 MCG tablet Take 1,000 mcg by mouth daily.    Historical Provider, MD  vitamin E 1000 UNIT capsule Take 1,000  Units by mouth daily.    Historical Provider, MD   Meds Ordered and Administered this Visit  Medications - No data to display  BP 141/65 mmHg  Pulse 92  Temp(Src) 97.3 F (36.3 C) (Tympanic)  Resp 16  Ht 5' (1.524 m)  Wt 135 lb (61.236 kg)  BMI 26.37 kg/m2  SpO2 99% No data found.   Physical Exam   GENERAL: NAD HEENT: no pharyngeal erythema, no exudate RESP: CTA B CARD: RRR ABD: +BS, mild suprapubic tenderness, no rebound or guarding, no flank tenderness  NEURO: CN II-XII grossly intact   ED Course  Procedures (including critical care time)  Labs Review Labs Reviewed  URINALYSIS COMPLETEWITH MICROSCOPIC ([facility] ONLY) - Abnormal; Notable for the following:    Color, Urine STRAW (*)    Leukocytes, UA 1+ (*)    Bacteria, UA FEW (*)    Squamous Epithelial / LPF 0-5 (*)    All other components within normal limits  URINE CULTURE    Imaging Review No results found.     MDM   1. Urinary tract infection without hematuria, site unspecified   Patient symptomatic but UA not truly indicative of UTI. Will treat patient with Macrobid, rest, hydration, seek medical attention if symptoms persist or worsen as discussed. I encourage patient to follow-up with her primary care physician given her multiple UTIs and to get a possible urology referral. Patient will call in a few days to know the status of her urine culture.   Paulina Fusi, MD 11/10/15 1159

## 2015-11-12 LAB — URINE CULTURE

## 2016-05-10 ENCOUNTER — Ambulatory Visit
Admission: EM | Admit: 2016-05-10 | Discharge: 2016-05-10 | Disposition: A | Discharge status: Home / Self Care | Payer: Medicare Other | Attending: Family Medicine | Admitting: Family Medicine

## 2016-05-10 ENCOUNTER — Encounter: Payer: Self-pay | Admitting: Gynecology

## 2016-05-10 DIAGNOSIS — J019 Acute sinusitis, unspecified: Secondary | ICD-10-CM | POA: Diagnosis not present

## 2016-05-10 DIAGNOSIS — B9689 Other specified bacterial agents as the cause of diseases classified elsewhere: Secondary | ICD-10-CM

## 2016-05-10 MED ORDER — AMOXICILLIN-POT CLAVULANATE 875-125 MG PO TABS: 1.0000 | ORAL_TABLET | Freq: Two times a day (BID) | ORAL | 0 refills | Status: AC

## 2016-05-10 NOTE — ED Triage Notes (Signed)
Patient c/o sinus pressure / headache over a week.

## 2016-05-10 NOTE — ED Provider Notes (Signed)
CSN: [id number]     Arrival date & time 05/10/16  1425 History   First MD Initiated Contact with Patient 05/10/16 1535     Chief Complaint  Patient presents with  . Facial Pain   (Consider location/radiation/quality/duration/timing/severity/associated sxs/prior Treatment) Patient presents today with concern for a sinus infection. She reports over a week history of sinus pressure over her maxillary sinuses accompany by cold symptoms (fever, congestion, running nose, sore throat, coughing). She reports not improving with OTC medication. She denies sick contact at home. She denies any alleviating or aggravating factors.       Past Medical History:  Diagnosis Date  . GERD (gastroesophageal reflux disease)   . Heart disease   . Hypercholesteremia   . Hypertension   . Thyroid disease    Past Surgical History:  Procedure Laterality Date  . COLONOSCOPY WITH PROPOFOL N/A 06/04/2015   Procedure: COLONOSCOPY WITH PROPOFOL;  Surgeon: Hulen Luster, MD;  Location: [facility] ENDOSCOPY;  Service: Gastroenterology;  Laterality: N/A;  . FOOT SURGERY     No family history on file. Social History  Substance Use Topics  . Smoking status: Never Smoker  . Smokeless tobacco: Never Used  . Alcohol use No   OB History    No data available     Review of Systems  Constitutional: Positive for fever. Negative for chills and fatigue.  HENT: Positive for congestion, rhinorrhea, sinus pain, sinus pressure and sore throat. Negative for ear pain and sneezing.   Respiratory: Positive for cough. Negative for shortness of breath.   Cardiovascular: Negative for chest pain and palpitations.  Gastrointestinal: Negative for abdominal pain, diarrhea, nausea and vomiting.  Musculoskeletal: Positive for myalgias.  Skin: Negative for rash.  Neurological: Positive for headaches. Negative for dizziness.    Allergies  Patient has no known allergies.  Home Medications   Prior to Admission medications   Medication Sig  Start Date End Date Taking? Authorizing Provider  acetaminophen (RA ACETAMINOPHEN) 650 MG CR tablet Take 650 mg by mouth daily.   Yes Historical Provider, MD  aspirin EC 81 MG tablet Take 81 mg by mouth at bedtime.   Yes Historical Provider, MD  bisoprolol-hydrochlorothiazide (ZIAC) 5-6.25 MG tablet Take 1 tablet by mouth daily. 05/02/15  Yes Historical Provider, MD  Cholecalciferol (VITAMIN D-3 PO) Take 1 tablet by mouth every morning.   Yes Historical Provider, MD  fexofenadine-pseudoephedrine (ALLEGRA-D ALLERGY & CONGESTION) 180-240 MG 24 hr tablet Take 1 tablet by mouth daily. 09/02/15  Yes Frederich Cha, MD  levothyroxine (SYNTHROID, LEVOTHROID) 100 MCG tablet Take 100 mcg by mouth daily before breakfast. 05/02/15  Yes Historical Provider, MD  lisinopril (PRINIVIL,ZESTRIL) 5 MG tablet Take 5 mg by mouth every morning. 05/02/15  Yes Historical Provider, MD  meclizine (ANTIVERT) 12.5 MG tablet Take 12.5 mg by mouth daily as needed for dizziness.   Yes Historical Provider, MD  Omega-3 Fatty Acids (FISH OIL) 1000 MG CAPS Take 1,000 mg by mouth 2 (two) times daily.   Yes Historical Provider, MD  pantoprazole (PROTONIX) 40 MG tablet Take 40 mg by mouth every morning. 05/02/15  Yes Historical Provider, MD  polyethylene glycol powder (GLYCOLAX/MIRALAX) powder Take 17 g by mouth daily. Mix in 8 oz of fluid and drink. 03/28/10  Yes Historical Provider, MD  simvastatin (ZOCOR) 80 MG tablet Take 80 mg by mouth at bedtime. 05/02/15  Yes Historical Provider, MD  vitamin B-12 (CYANOCOBALAMIN) 1000 MCG tablet Take 1,000 mcg by mouth daily.   Yes Historical Provider, MD  vitamin E 1000 UNIT capsule Take 1,000 Units by mouth daily.   Yes Historical Provider, MD  amoxicillin-clavulanate (AUGMENTIN) 875-125 MG tablet Take 1 tablet by mouth 2 (two) times daily. 05/10/16 05/17/16  Barry Dienes, NP  chlorpheniramine-HYDROcodone (TUSSIONEX PENNKINETIC ER) 10-8 MG/5ML SUER Take 5 mLs by mouth every 12 (twelve) hours as needed for  cough. 09/02/15   Frederich Cha, MD  nitrofurantoin, macrocrystal-monohydrate, (MACROBID) 100 MG capsule Take 1 capsule (100 mg total) by mouth 2 (two) times daily. 11/10/15   Paulina Fusi, MD  oseltamivir (TAMIFLU) 75 MG capsule Take 1 capsule (75 mg total) by mouth 2 (two) times daily. 09/02/15   Frederich Cha, MD  primidone (MYSOLINE) 50 MG tablet Take 50 mg by mouth 2 (two) times daily. 04/21/15   Historical Provider, MD  promethazine (PHENERGAN) 12.5 MG tablet Take 1 tablet (12.5 mg total) by mouth every 6 (six) hours as needed for nausea or vomiting. 05/06/15   Nicholes Mango, MD   Meds Ordered and Administered this Visit  Medications - No data to display  BP (!) 129/58 (BP Location: Left Arm)   Pulse 67   Temp 97.2 F (36.2 C) (Tympanic)   Resp 16   Ht 5' (1.524 m)   Wt 145 lb (65.8 kg)   SpO2 98%   BMI 28.32 kg/m  No data found.   Physical Exam  Constitutional: She is oriented to person, place, and time. She appears well-developed and well-nourished.  HENT:  Head: Normocephalic and atraumatic.  Right Ear: External ear normal.  Left Ear: External ear normal.  Nose: Nose normal.  Mouth/Throat: Oropharynx is clear and moist. No oropharyngeal exudate.  TM normal bilaterally. Maxillary sinuses tender on palpation  Eyes: Conjunctivae and EOM are normal. Pupils are equal, round, and reactive to light.  Neck: Normal range of motion. Neck supple.  Cardiovascular: Normal rate, regular rhythm and normal heart sounds.   Pulmonary/Chest: Effort normal and breath sounds normal. No respiratory distress. She has no wheezes.  Abdominal: Soft. Bowel sounds are normal. She exhibits no distension. There is no tenderness.  Lymphadenopathy:    She has no cervical adenopathy.  Neurological: She is alert and oriented to person, place, and time.  Skin: Skin is warm and dry.  Psychiatric: She has a normal mood and affect.  Nursing note and vitals reviewed.   Urgent Care Course   Clinical Course      Procedures (including critical care time)  Labs Review Labs Reviewed - No data to display  Imaging Review No results found.   MDM   1. Acute bacterial sinusitis    Prescriptions given (see above). Reviewed directions for usage and side effects. Patient states understanding and will call with questions or problems. Patient instructed to call or follow up with his/her primary care doctor if failure to improve or change in symptoms. Discharge instruction given.    Barry Dienes, NP 05/10/16 [phone]

## 2016-12-13 IMAGING — CT CT HEAD W/O CM
1 series · 16 of 30 positions shown, 20 images · non-contrast
Comparison: Head CT dated 09/27/2009.

CLINICAL DATA: Nausea this morning, dizziness, headache and chest
pain.

EXAM:
CT HEAD WITHOUT CONTRAST
TECHNIQUE: Contiguous axial images were obtained from the base of the skull
through the vertex without intravenous contrast.

[Series 2: head wo · axial · 0.41mm/px · z∈[-141,-10]mm · 16 of 30 slices shown, 20 images]
[im 2/30  brain]
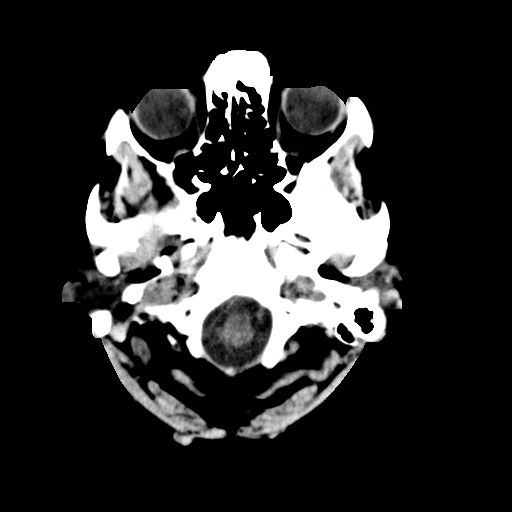
[im 2/30  bone]
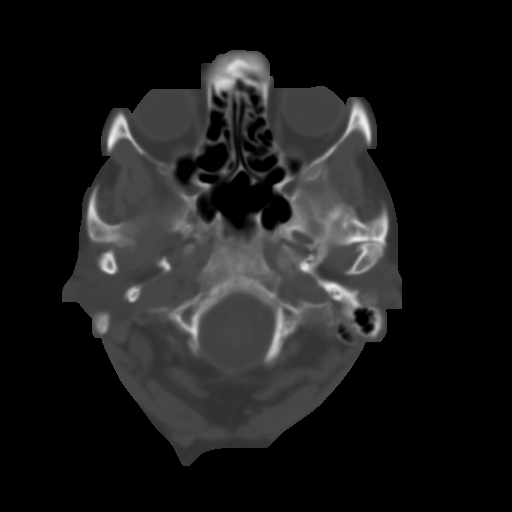
[im 4/30  brain]
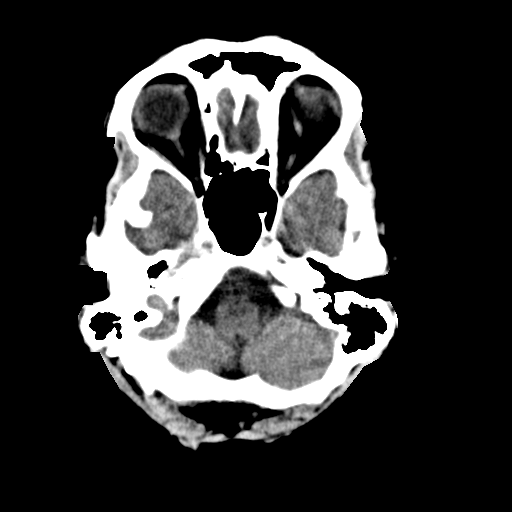
[im 6/30  brain]
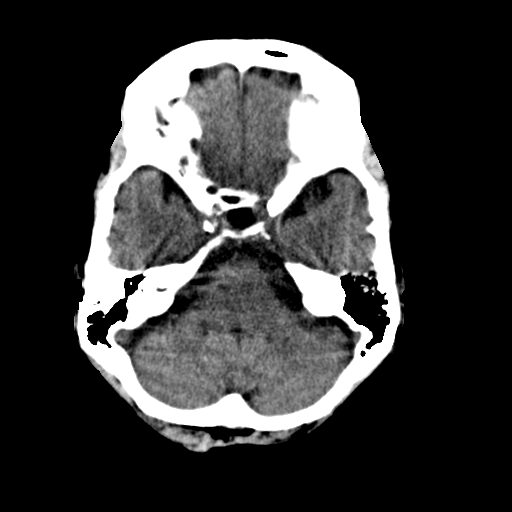
[im 8/30  brain]
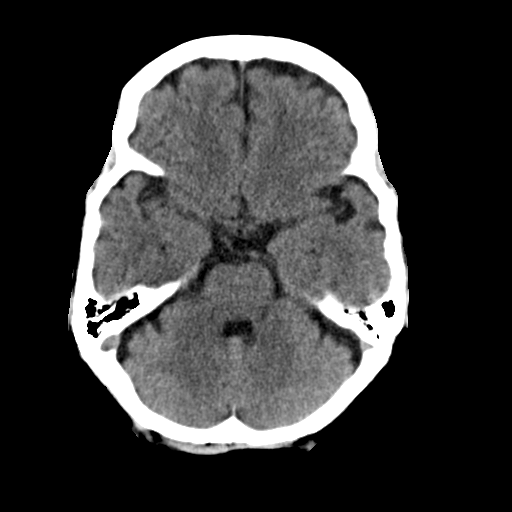
[im 9/30  brain]
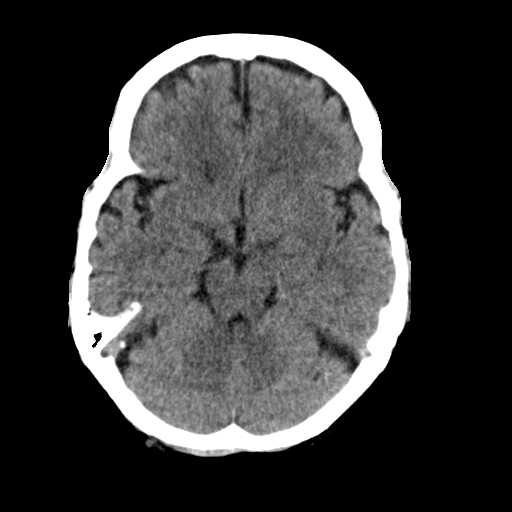
[im 9/30  bone]
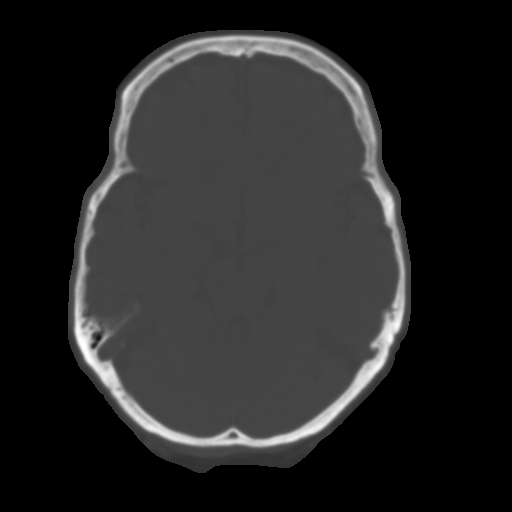
[im 11/30  brain]
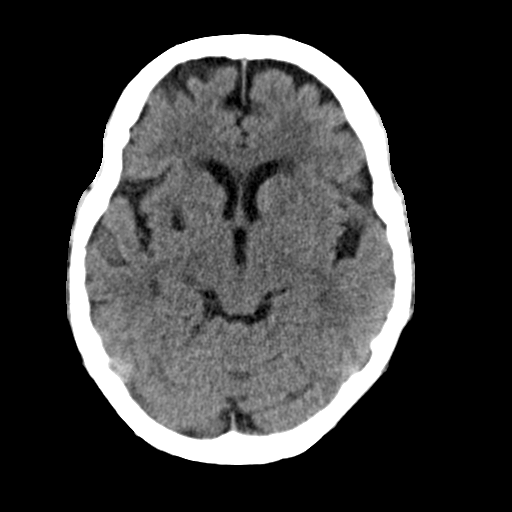
[im 13/30  brain]
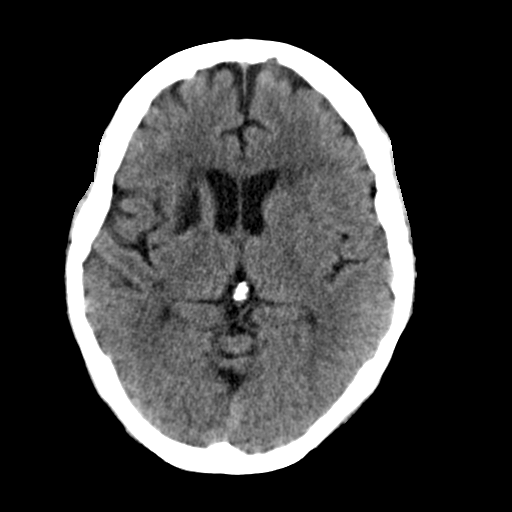
[im 15/30  brain]
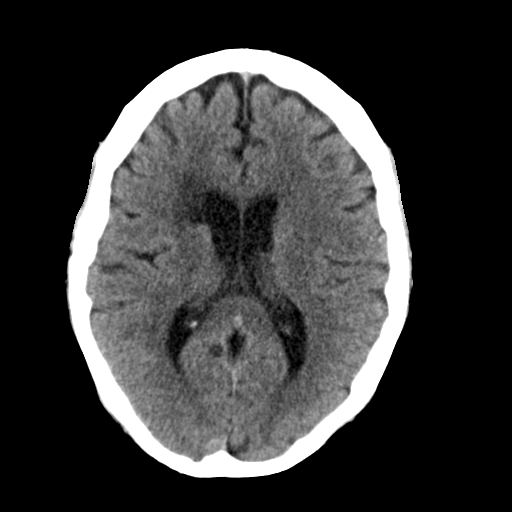
[im 16/30  brain]
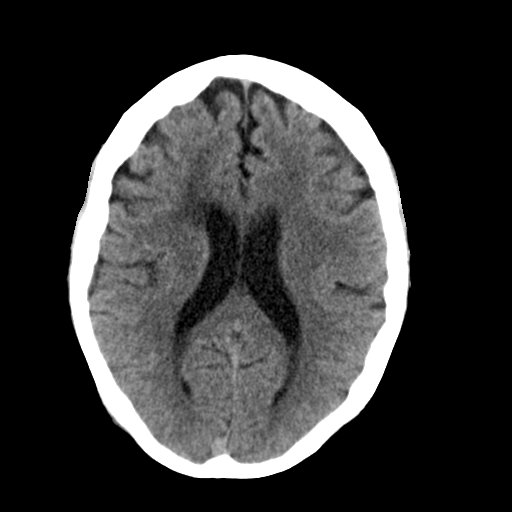
[im 16/30  bone]
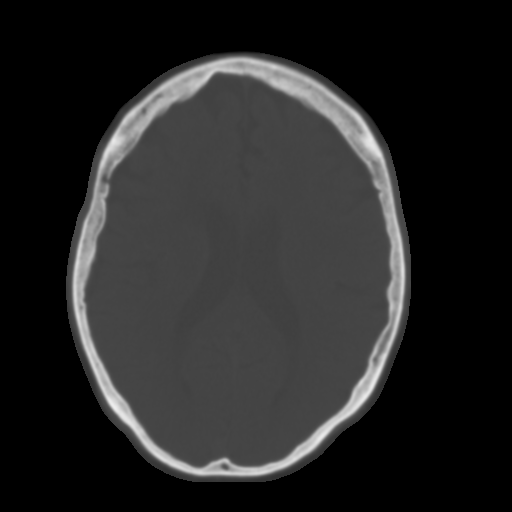
[im 18/30  brain]
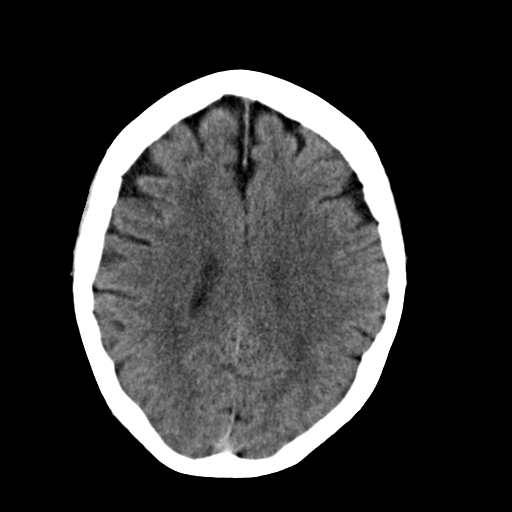
[im 20/30  brain]
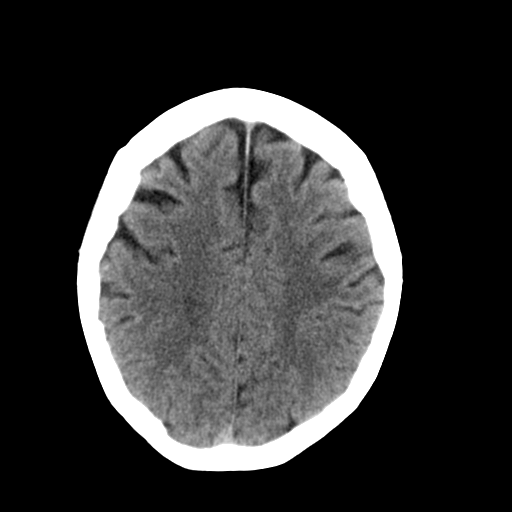
[im 22/30  brain]
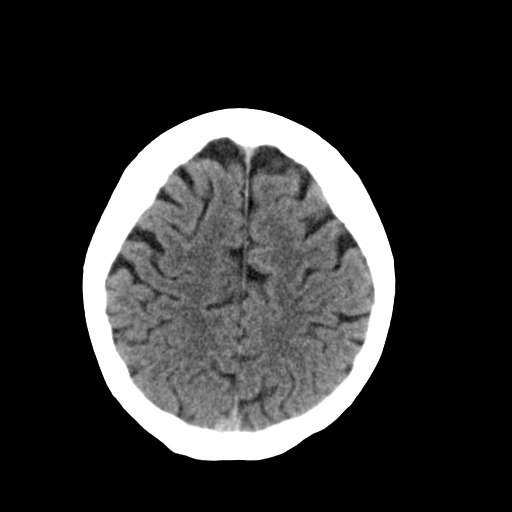
[im 23/30  brain]
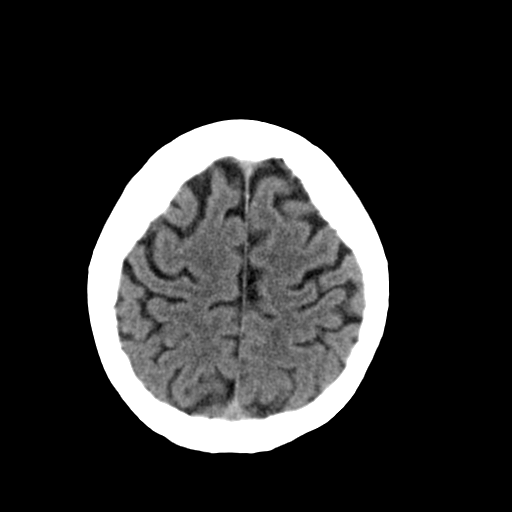
[im 23/30  bone]
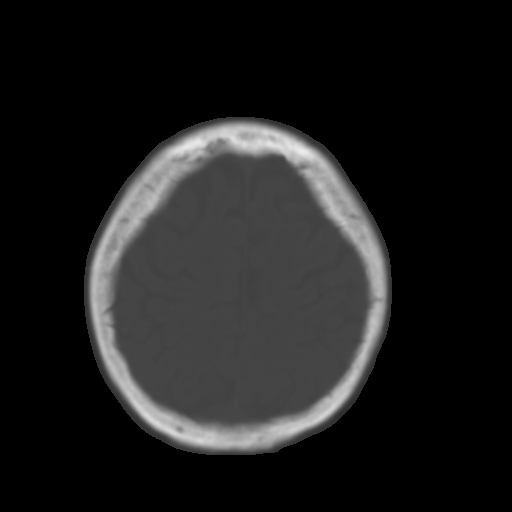
[im 25/30  brain]
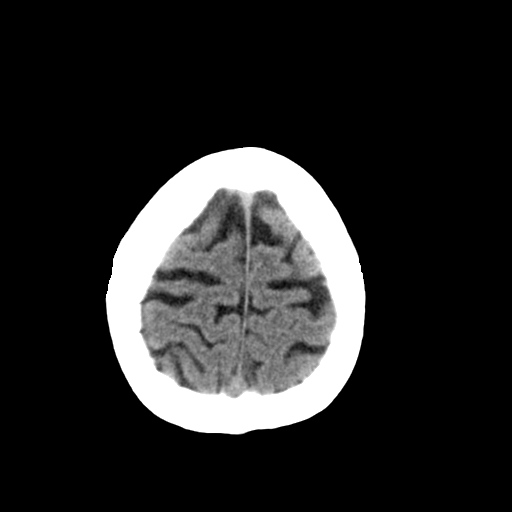
[im 27/30  brain]
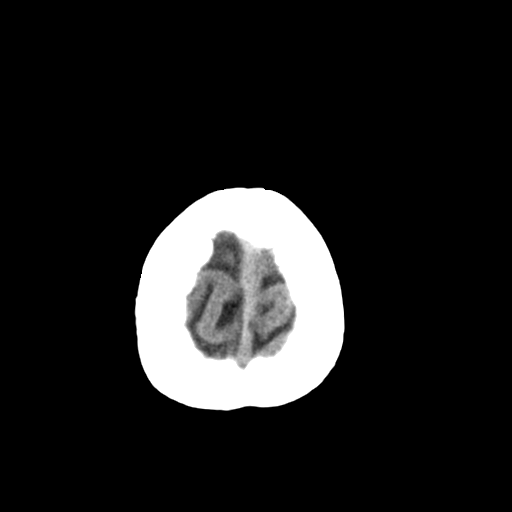
[im 29/30  brain]
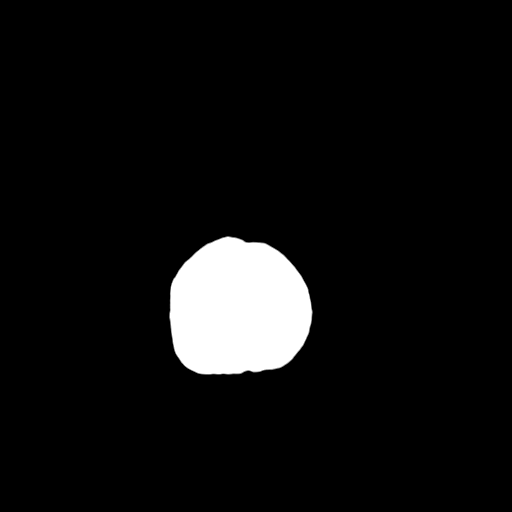

[16 of 30 positions shown; findings below may reference images not displayed]

FINDINGS: Again noted is mild generalized brain atrophy with commensurate
dilatation of the ventricles and sulci. Mild chronic small vessel
ischemic changes again noted within the periventricular white matter
regions bilaterally. Old infarct again noted within the right basal
ganglia region with associated encephalomalacia.

There is no mass, hemorrhage, edema, or other evidence of acute
parenchymal abnormality. No extra-axial hemorrhage.

No acute osseous abnormality. Visualized upper paranasal sinuses are
clear. Mastoid air cells are clear. Superficial soft tissues are
unremarkable.
IMPRESSION: 1. No evidence of acute intracranial abnormality. No intracranial
mass, hemorrhage, or edema.
2. Mild atrophy and chronic ischemic changes as detailed above.

## 2017-04-06 ENCOUNTER — Other Ambulatory Visit: Payer: Self-pay | Admitting: Gastroenterology

## 2017-04-06 DIAGNOSIS — R1084 Generalized abdominal pain: Secondary | ICD-10-CM

## 2017-04-06 DIAGNOSIS — R140 Abdominal distension (gaseous): Secondary | ICD-10-CM

## 2017-04-13 ENCOUNTER — Ambulatory Visit
Admission: RE | Admit: 2017-04-13 | Discharge: 2017-04-13 | Disposition: A | Discharge status: Home / Self Care | Payer: Medicare Other | Source: Ambulatory Visit | Attending: Gastroenterology | Admitting: Gastroenterology

## 2017-04-13 DIAGNOSIS — R140 Abdominal distension (gaseous): Secondary | ICD-10-CM | POA: Diagnosis present

## 2017-04-13 DIAGNOSIS — Z90710 Acquired absence of both cervix and uterus: Secondary | ICD-10-CM | POA: Insufficient documentation

## 2017-04-13 DIAGNOSIS — R1084 Generalized abdominal pain: Secondary | ICD-10-CM | POA: Insufficient documentation

## 2018-07-20 ENCOUNTER — Other Ambulatory Visit: Payer: Self-pay | Admitting: Acute Care

## 2018-07-20 DIAGNOSIS — I639 Cerebral infarction, unspecified: Secondary | ICD-10-CM

## 2018-08-12 ENCOUNTER — Ambulatory Visit
Admission: RE | Admit: 2018-08-12 | Discharge: 2018-08-12 | Disposition: A | Discharge status: Home / Self Care | Payer: Medicare Other | Source: Ambulatory Visit | Attending: Acute Care | Admitting: Acute Care

## 2018-08-12 DIAGNOSIS — I639 Cerebral infarction, unspecified: Secondary | ICD-10-CM | POA: Diagnosis present

## 2020-06-20 ENCOUNTER — Emergency Department: Payer: Medicare Other

## 2020-06-20 ENCOUNTER — Encounter: Payer: Self-pay | Admitting: Emergency Medicine

## 2020-06-20 ENCOUNTER — Emergency Department
Admission: EM | Admit: 2020-06-20 | Discharge: 2020-06-20 | Disposition: A | Discharge status: Home / Self Care | Payer: Medicare Other | Attending: Emergency Medicine | Admitting: Emergency Medicine

## 2020-06-20 ENCOUNTER — Other Ambulatory Visit: Payer: Self-pay

## 2020-06-20 DIAGNOSIS — R29818 Other symptoms and signs involving the nervous system: Secondary | ICD-10-CM | POA: Diagnosis not present

## 2020-06-20 DIAGNOSIS — Z5321 Procedure and treatment not carried out due to patient leaving prior to being seen by health care provider: Secondary | ICD-10-CM | POA: Diagnosis not present

## 2020-06-20 DIAGNOSIS — R531 Weakness: Secondary | ICD-10-CM | POA: Diagnosis present

## 2020-06-20 LAB — COMPREHENSIVE METABOLIC PANEL
ALT: 13 U/L (ref 0–44)
AST: 19 U/L (ref 15–41)
Albumin: 4.1 g/dL (ref 3.5–5.0)
Alkaline Phosphatase: 61 U/L (ref 38–126)
Anion gap: 10 (ref 5–15)
BUN: 15 mg/dL (ref 8–23)
CO2: 25 mmol/L (ref 22–32)
Calcium: 9.7 mg/dL (ref 8.9–10.3)
Chloride: 104 mmol/L (ref 98–111)
Creatinine, Ser: 0.67 mg/dL (ref 0.44–1.00)
GFR, Estimated: >60 mL/min (ref >60)
Glucose, Bld: 126 mg/dL — ABNORMAL HIGH (ref 70–99)
Potassium: 4.2 mmol/L (ref 3.5–5.1)
Sodium: 139 mmol/L (ref 135–145)
Total Bilirubin: 0.5 mg/dL (ref 0.3–1.2)
Total Protein: 7.4 g/dL (ref 6.5–8.1)

## 2020-06-20 LAB — CBC
HCT: 37.6 % (ref 36.0–46.0)
Hemoglobin: 12.6 g/dL (ref 12.0–15.0)
MCH: 34.1 pg — ABNORMAL HIGH (ref 26.0–34.0)
MCHC: 33.5 g/dL (ref 30.0–36.0)
MCV: 101.6 fL — ABNORMAL HIGH (ref 80.0–100.0)
Platelets: 200 10*3/uL (ref 150–400)
RBC: 3.7 MIL/uL — ABNORMAL LOW (ref 3.87–5.11)
RDW: 13.3 % (ref 11.5–15.5)
WBC: 5.6 10*3/uL (ref 4.0–10.5)
nRBC: 0 % (ref 0.0–0.2)

## 2020-06-20 LAB — DIFFERENTIAL
Abs Immature Granulocytes: 0.01 10*3/uL (ref 0.00–0.07)
Basophils Absolute: 0.1 10*3/uL (ref 0.0–0.1)
Basophils Relative: 1 %
Eosinophils Absolute: 0.1 10*3/uL (ref 0.0–0.5)
Eosinophils Relative: 2 %
Immature Granulocytes: 0 %
Lymphocytes Relative: 36 %
Lymphs Abs: 2 10*3/uL (ref 0.7–4.0)
Monocytes Absolute: 0.7 10*3/uL (ref 0.1–1.0)
Monocytes Relative: 12 %
Neutro Abs: 2.8 10*3/uL (ref 1.7–7.7)
Neutrophils Relative %: 49 %

## 2020-06-20 LAB — PROTIME-INR
INR: 1 (ref 0.8–1.2)
Prothrombin Time: 12.9 seconds (ref 11.4–15.2)

## 2020-06-20 LAB — CBG MONITORING, ED: Glucose-Capillary: 116 mg/dL — ABNORMAL HIGH (ref 70–99)

## 2020-06-20 LAB — APTT: aPTT: 32 seconds (ref 24–36)

## 2020-06-20 NOTE — ED Triage Notes (Signed)
Pt comes into the ED via POV c/o weakness, slurred speech, and mild confusion that starts on christmas morning.  Pt states that most of the symptoms have resolved, but she is still having weakness.  Pt states at baseline the weakness has been there but every once in a while the weakness will get worse.  Pt's MD wanted her evaluated for possible TIA's.  Pt currently neurologically intact with even and unlabored respirations.

## 2020-06-20 NOTE — ED Notes (Signed)
Pt reports she is leaving and will follow up with MD

## 2020-12-27 ENCOUNTER — Ambulatory Visit
Admission: EM | Admit: 2020-12-27 | Discharge: 2020-12-27 | Disposition: A | Discharge status: Other Acute Inpatient Hospital | Payer: Medicare Other

## 2020-12-27 ENCOUNTER — Other Ambulatory Visit: Payer: Self-pay

## 2020-12-27 ENCOUNTER — Emergency Department
Admission: EM | Admit: 2020-12-27 | Discharge: 2020-12-27 | Disposition: A | Discharge status: Home / Self Care | Payer: Medicare Other | Attending: Student in an Organized Health Care Education/Training Program | Admitting: Student in an Organized Health Care Education/Training Program

## 2020-12-27 ENCOUNTER — Emergency Department: Payer: Medicare Other

## 2020-12-27 DIAGNOSIS — Z79899 Other long term (current) drug therapy: Secondary | ICD-10-CM | POA: Diagnosis not present

## 2020-12-27 DIAGNOSIS — K5732 Diverticulitis of large intestine without perforation or abscess without bleeding: Secondary | ICD-10-CM | POA: Diagnosis not present

## 2020-12-27 DIAGNOSIS — Z7982 Long term (current) use of aspirin: Secondary | ICD-10-CM | POA: Insufficient documentation

## 2020-12-27 DIAGNOSIS — R1030 Lower abdominal pain, unspecified: Secondary | ICD-10-CM | POA: Diagnosis present

## 2020-12-27 DIAGNOSIS — E039 Hypothyroidism, unspecified: Secondary | ICD-10-CM | POA: Insufficient documentation

## 2020-12-27 DIAGNOSIS — R1031 Right lower quadrant pain: Secondary | ICD-10-CM | POA: Diagnosis not present

## 2020-12-27 DIAGNOSIS — I10 Essential (primary) hypertension: Secondary | ICD-10-CM | POA: Diagnosis not present

## 2020-12-27 DIAGNOSIS — R1032 Left lower quadrant pain: Secondary | ICD-10-CM

## 2020-12-27 DIAGNOSIS — K219 Gastro-esophageal reflux disease without esophagitis: Secondary | ICD-10-CM | POA: Diagnosis not present

## 2020-12-27 DIAGNOSIS — K5792 Diverticulitis of intestine, part unspecified, without perforation or abscess without bleeding: Secondary | ICD-10-CM

## 2020-12-27 LAB — URINALYSIS, COMPLETE (UACMP) WITH MICROSCOPIC
Bilirubin Urine: NEGATIVE
Glucose, UA: NEGATIVE mg/dL
Hgb urine dipstick: NEGATIVE
Ketones, ur: 20 mg/dL — AB
Nitrite: NEGATIVE
Protein, ur: NEGATIVE mg/dL
Specific Gravity, Urine: 1.013 (ref 1.005–1.030)
pH: 6 (ref 5.0–8.0)

## 2020-12-27 LAB — CBC WITH DIFFERENTIAL/PLATELET
Abs Immature Granulocytes: 0.02 10*3/uL (ref 0.00–0.07)
Basophils Absolute: 0 10*3/uL (ref 0.0–0.1)
Basophils Relative: 0 %
Eosinophils Absolute: 0.1 10*3/uL (ref 0.0–0.5)
Eosinophils Relative: 1 %
HCT: 38.5 % (ref 36.0–46.0)
Hemoglobin: 13.2 g/dL (ref 12.0–15.0)
Immature Granulocytes: 0 %
Lymphocytes Relative: 37 %
Lymphs Abs: 2.5 10*3/uL (ref 0.7–4.0)
MCH: 34.8 pg — ABNORMAL HIGH (ref 26.0–34.0)
MCHC: 34.3 g/dL (ref 30.0–36.0)
MCV: 101.6 fL — ABNORMAL HIGH (ref 80.0–100.0)
Monocytes Absolute: 0.5 10*3/uL (ref 0.1–1.0)
Monocytes Relative: 8 %
Neutro Abs: 3.6 10*3/uL (ref 1.7–7.7)
Neutrophils Relative %: 54 %
Platelets: 203 10*3/uL (ref 150–400)
RBC: 3.79 MIL/uL — ABNORMAL LOW (ref 3.87–5.11)
RDW: 12.8 % (ref 11.5–15.5)
Smear Review: NORMAL
WBC: 6.7 10*3/uL (ref 4.0–10.5)
nRBC: 0 % (ref 0.0–0.2)

## 2020-12-27 LAB — COMPREHENSIVE METABOLIC PANEL
ALT: 19 U/L (ref 0–44)
AST: 20 U/L (ref 15–41)
Albumin: 4.3 g/dL (ref 3.5–5.0)
Alkaline Phosphatase: 57 U/L (ref 38–126)
Anion gap: 12 (ref 5–15)
BUN: 13 mg/dL (ref 8–23)
CO2: 26 mmol/L (ref 22–32)
Calcium: 9.4 mg/dL (ref 8.9–10.3)
Chloride: 103 mmol/L (ref 98–111)
Creatinine, Ser: 0.59 mg/dL (ref 0.44–1.00)
GFR, Estimated: >60 mL/min (ref >60)
Glucose, Bld: 97 mg/dL (ref 70–99)
Potassium: 3.5 mmol/L (ref 3.5–5.1)
Sodium: 141 mmol/L (ref 135–145)
Total Bilirubin: 0.5 mg/dL (ref 0.3–1.2)
Total Protein: 7.6 g/dL (ref 6.5–8.1)

## 2020-12-27 LAB — LIPASE, BLOOD: Lipase: 32 U/L (ref 11–51)

## 2020-12-27 MED ORDER — ONDANSETRON HCL 4 MG PO TABS: 4.0000 mg | ORAL_TABLET | Freq: Three times a day (TID) | ORAL | 0 refills | Status: DC | PRN

## 2020-12-27 MED ORDER — METOCLOPRAMIDE HCL 5 MG/ML IJ SOLN
10.0000 mg | Freq: Once | INTRAMUSCULAR | Status: AC
  Administered 2020-12-27: 10 mg via INTRAVENOUS
  Filled 2020-12-27: qty 2

## 2020-12-27 MED ORDER — MORPHINE SULFATE (PF) 2 MG/ML IV SOLN
2.0000 mg | Freq: Once | INTRAVENOUS | Status: AC
  Administered 2020-12-27: 2 mg via INTRAVENOUS
  Filled 2020-12-27: qty 1

## 2020-12-27 MED ORDER — METRONIDAZOLE 500 MG PO TABS: 500.0000 mg | ORAL_TABLET | Freq: Three times a day (TID) | ORAL | 0 refills | Status: AC

## 2020-12-27 MED ORDER — ONDANSETRON HCL 4 MG/2ML IJ SOLN
4.0000 mg | Freq: Once | INTRAMUSCULAR | Status: AC
Start: 2020-12-27 — End: 2020-12-27
  Administered 2020-12-27: 4 mg via INTRAVENOUS
  Filled 2020-12-27: qty 2

## 2020-12-27 MED ORDER — HYDROCODONE-ACETAMINOPHEN 5-325 MG PO TABS: 1.0000 | ORAL_TABLET | Freq: Four times a day (QID) | ORAL | 0 refills | Status: DC | PRN

## 2020-12-27 MED ORDER — SODIUM CHLORIDE 0.9 % IV BOLUS
1000.0000 mL | Freq: Once | INTRAVENOUS | Status: AC
Start: 2020-12-27 — End: 2020-12-27
  Administered 2020-12-27: 1000 mL via INTRAVENOUS

## 2020-12-27 MED ORDER — CIPROFLOXACIN HCL 500 MG PO TABS: 500.0000 mg | ORAL_TABLET | Freq: Two times a day (BID) | ORAL | 0 refills | Status: DC

## 2020-12-27 NOTE — Discharge Instructions (Addendum)
Due to the severe nature of your abdominal pain, recent chills, nausea and unable to keep any fluids down, recommend you go to the ER now for further evaluation. Will transport you by ambulance to [facility].

## 2020-12-27 NOTE — ED Notes (Signed)
Patient is being discharged from the Urgent Care and sent to the Emergency Department via EMS . Per Zoe Lan, NP, patient is in need of higher level of care due to Severe Abdominal Pain. Patient is aware and verbalizes understanding of plan of care.  Vitals:   12/27/20 1040  BP: (!) 155/78  Pulse: 77  Resp: 17  Temp: 97.8 F (36.6 C)  SpO2: 97%

## 2020-12-27 NOTE — ED Provider Notes (Signed)
[facility]    CSN: [id number] Arrival date & time: 12/27/20  3532      History   Chief Complaint Chief Complaint  Patient presents with   Abdominal Pain    HPI Janet Spears is a 85 y.o. female.   85 year old female presents with right lower and left lower abdominal pain that woke her out of sleep last night. Started with some diarrhea about 1 week ago. Had intermittent pain and occasional blood but thought it was due to hemorrhoids. Diarrhea resolved 2 days ago but now very nauseous. Is unable to eat or drink anything this morning and has been dry heaving. Pain started in lower abdomen last night- woke her from sleep and has been constant since. Pain level 10/10 and having difficulty walking due to pain. Feels more comfortable in fetal position. Did have some chills 2 days ago but none now. No documented fever. Denies any nasal congestion, coughing or increase in back pain. Has been urinating as usual with no pain. No bowel movement in past 24 hours. Took Gas X with no relief. Has history of HTN, thyroid disorder, tremors, GERD, and hyperlipidemia. Last colonoscopy 2016. Currently on Ziac, Synthroid, Zocor, Primidone, aspirin and Protonix daily and Tylenol and Meclizine prn. Patient drove herself to Urgent Care- no family members available.   The history is provided by the patient.   Past Medical History:  Diagnosis Date   GERD (gastroesophageal reflux disease)    Heart disease    Hypercholesteremia    Hypertension    Thyroid disease     Patient Active Problem List   Diagnosis Date Noted   Chest pain 05/05/2015   Hyperlipidemia 05/05/2015   Vasovagal syncope 05/05/2015   Hypertension 05/05/2015   Hypothyroidism 05/05/2015   Gastroenteritis 05/05/2015    Past Surgical History:  Procedure Laterality Date   COLONOSCOPY WITH PROPOFOL N/A 06/04/2015   Procedure: COLONOSCOPY WITH PROPOFOL;  Surgeon: Hulen Luster, MD;  Location: [facility];  Service:  Gastroenterology;  Laterality: N/A;   FOOT SURGERY      OB History   No obstetric history on file.      Home Medications    Prior to Admission medications   Medication Sig Start Date End Date Taking? Authorizing Provider  acetaminophen (TYLENOL) 650 MG CR tablet Take 650 mg by mouth daily.   Yes [provider]  aspirin EC 81 MG tablet Take 81 mg by mouth at bedtime.   Yes [provider]  bisoprolol-hydrochlorothiazide (ZIAC) 5-6.25 MG tablet Take 1 tablet by mouth daily. 05/02/15  Yes [provider]  Cholecalciferol (VITAMIN D-3 PO) Take 1 tablet by mouth every morning.   Yes [provider]  fexofenadine-pseudoephedrine (ALLEGRA-D ALLERGY & CONGESTION) 180-240 MG 24 hr tablet Take 1 tablet by mouth daily. 09/02/15  Yes Frederich Cha, MD  levothyroxine (SYNTHROID, LEVOTHROID) 100 MCG tablet Take 100 mcg by mouth daily before breakfast. 05/02/15  Yes [provider]  meclizine (ANTIVERT) 12.5 MG tablet Take 12.5 mg by mouth daily as needed for dizziness.   Yes [provider]  Omega-3 Fatty Acids (FISH OIL) 1000 MG CAPS Take 1,000 mg by mouth 2 (two) times daily.   Yes [provider]  pantoprazole (PROTONIX) 40 MG tablet Take 40 mg by mouth every morning. 05/02/15  Yes [provider]  primidone (MYSOLINE) 50 MG tablet Take 50 mg by mouth 2 (two) times daily. 04/21/15  Yes [provider]  simvastatin (ZOCOR) 80 MG  tablet Take 80 mg by mouth at bedtime. 05/02/15  Yes [provider]  vitamin B-12 (CYANOCOBALAMIN) 1000 MCG tablet Take 1,000 mcg by mouth daily.   Yes [provider]  vitamin E 1000 UNIT capsule Take 1,000 Units by mouth daily.   Yes [provider]    Family History History reviewed. No pertinent family history.  Social History Social History   Tobacco Use   Smoking status: Never   Smokeless tobacco: Never  Substance Use Topics   Alcohol use: No   Drug use:  No     Allergies   Patient has no known allergies.   Review of Systems Review of Systems  Constitutional:  Positive for appetite change, chills and fatigue. Negative for activity change and fever.  HENT:  Negative for congestion, rhinorrhea, sinus pain and sore throat.   Respiratory:  Negative for cough.   Cardiovascular:  Negative for chest pain and palpitations.  Gastrointestinal:  Positive for abdominal pain, blood in stool, diarrhea and nausea. Negative for constipation. Vomiting: dry heaving. Genitourinary:  Negative for decreased urine volume, difficulty urinating, dysuria, flank pain, frequency, hematuria and urgency.  Musculoskeletal:  Negative for back pain, neck pain and neck stiffness.  Skin:  Negative for color change and rash.  Allergic/Immunologic: Negative for environmental allergies and food allergies.  Neurological:  Positive for seizures (tremor disorder) and light-headedness. Negative for syncope, weakness and numbness.  Hematological:  Negative for adenopathy. Does not bruise/bleed easily.    Physical Exam Triage Vital Signs ED Triage Vitals  Enc Vitals Group     BP 12/27/20 1040 (!) 155/78     Pulse Rate 12/27/20 1040 77     Resp 12/27/20 1040 17     Temp 12/27/20 1040 97.8 F (36.6 C)     Temp Source 12/27/20 1040 Oral     SpO2 12/27/20 1040 97 %     Weight 12/27/20 1037 143 lb (64.9 kg)     Height 12/27/20 1037 4\' 11"  (1.499 m)     Head Circumference --      Peak Flow --      Pain Score 12/27/20 1037 10     Pain Loc --      Pain Edu? --      Excl. in [place]? --    No data found.  Updated Vital Signs BP (!) 155/78 (BP Location: Left Arm)   Pulse 77   Temp 97.8 F (36.6 C) (Oral)   Resp 17   Ht 4\' 11"  (1.499 m)   Wt 143 lb (64.9 kg)   SpO2 97%   BMI 28.88 kg/m   Visual Acuity Right Eye Distance:   Left Eye Distance:   Bilateral Distance:    Right Eye Near:   Left Eye Near:    Bilateral Near:     Physical Exam Vitals and nursing note  reviewed.  Constitutional:      General: She is awake.     Appearance: She is well-developed and well-groomed. She is ill-appearing.     Comments: She is sitting in the exam chair hunched over due to pain and appears uncomfortable.   HENT:     Head: Normocephalic and atraumatic.     Right Ear: Hearing normal.     Left Ear: Hearing normal.  Eyes:     Extraocular Movements: Extraocular movements intact.     Conjunctiva/sclera: Conjunctivae normal.  Cardiovascular:     Rate and Rhythm: Normal rate and regular rhythm.  Heart sounds: Normal heart sounds. No murmur heard. Pulmonary:     Effort: Pulmonary effort is normal. No respiratory distress.     Breath sounds: Normal breath sounds. No stridor.  Abdominal:     General: Abdomen is flat. Bowel sounds are normal.     Palpations: Abdomen is soft.     Tenderness: There is abdominal tenderness in the right lower quadrant and left lower quadrant. There is guarding. There is no right CVA tenderness or left CVA tenderness.     Hernia: No hernia is present.    Musculoskeletal:     Cervical back: Normal range of motion.  Skin:    General: Skin is warm and dry.     Capillary Refill: Capillary refill takes less than 2 seconds.  Neurological:     General: No focal deficit present.     Mental Status: She is alert and oriented to person, place, and time.  Psychiatric:        Mood and Affect: Mood normal.        Behavior: Behavior normal. Behavior is cooperative.        Thought Content: Thought content normal.        Judgment: Judgment normal.     UC Treatments / Results  Labs (all labs ordered are listed, but only abnormal results are displayed) Labs Reviewed - No data to display  EKG   Radiology No results found.  Procedures Procedures (including critical care time)  Medications Ordered in UC Medications - No data to display  Initial Impression / Assessment and Plan / UC Course  I have reviewed the triage vital signs and  the nursing notes.  Pertinent labs & imaging results that were available during my care of the patient were reviewed by me and considered in my medical decision making (see chart for details).     Due to severe lower abdominal pain, worse on right compared to left, with nausea and unable to keep any fluids down, recommend further evaluation at the ER with imaging. Concern over possible appendicitis or bowel obstruction/rupture or other significant etiology. Since patient does not have family to transport her and she is unable to walk without assistance due to pain level, will transport patient to ER by EMS.  Final Clinical Impressions(s) / UC Diagnoses   Final diagnoses:  Right lower quadrant abdominal pain  Abdominal pain, left lower quadrant     Discharge Instructions      Due to the severe nature of your abdominal pain, recent chills, nausea and unable to keep any fluids down, recommend you go to the ER now for further evaluation. Will transport you by ambulance to [facility].      ED Prescriptions   None    PDMP not reviewed this encounter.   Katy Apo, NP 12/27/20 1215

## 2020-12-27 NOTE — ED Triage Notes (Signed)
Patient states that she started having diarrhea one week ago and had pain intermittently. States that diarrhea has subsided but has continued to have nausea. Patient reports that pain returned last night and has been severe in lower abdomen, states that she feels like she did when she had ovaries. Patient stumbled in the door on the way back to triage, states that pain is severe it makes her lose her balance.

## 2020-12-27 NOTE — Discharge Instructions (Addendum)
Take the antibiotics as directed, and the nausea medicine as needed.  Continue to hydrate and start with clear diet, and advance to soft food and salty food as tolerated.  Follow-up with gastroenterology for ongoing management.  Return to the ED for worsening symptoms.

## 2020-12-27 NOTE — ED Triage Notes (Signed)
RLQ pain since Saturday, nausea

## 2020-12-27 NOTE — ED Provider Notes (Signed)
[facility] Emergency Department Provider Note  ____________________________________________   Event Date/Time   First MD Initiated Contact with Patient 12/27/20 1237     (approximate)  I have reviewed the triage vital signs and the nursing notes.   HISTORY  Chief Complaint Abdominal Pain (Right lower abd pain )  HPI Janet Spears is a 85 y.o. female presents to the ED via EMS from [facility], with the patient history present for 1 week complaint of intermittent diarrhea and lower abdominal pain.  Patient reports onset of low abdominal pain and diarrhea that began last Saturday night.  She describes sharp lower abdominal pain, that has persisted in that timeframe.  She presented to the urgent care today, after she began to experience inability to eat or drink anything without subsequent nausea and vomiting.  She reports some bright red blood per rectum with her diarrhea,, and notes a history of hemorrhoids.  Patient denies a history of colitis, diverticulitis, or kidney stones.     Past Medical History:  Diagnosis Date   GERD (gastroesophageal reflux disease)    Heart disease    Hypercholesteremia    Hypertension    Thyroid disease     Patient Active Problem List   Diagnosis Date Noted   Chest pain 05/05/2015   Hyperlipidemia 05/05/2015   Vasovagal syncope 05/05/2015   Hypertension 05/05/2015   Hypothyroidism 05/05/2015   Gastroenteritis 05/05/2015    Past Surgical History:  Procedure Laterality Date   COLONOSCOPY WITH PROPOFOL N/A 06/04/2015   Procedure: COLONOSCOPY WITH PROPOFOL;  Surgeon: Hulen Luster, MD;  Location: [facility] END[facility]COPY;  Service: Gastroenterology;  Laterality: N/A;   FOOT SURGERY      Prior to Admission medications   Medication Sig Start Date End Date Taking? Authorizing Provider  ciprofloxacin (CIPRO) 500 MG tablet Take 1 tablet (500 mg total) by mouth 2 (two) times daily. 12/27/20  Yes Vittorio Mohs, Dannielle Karvonen,  PA-C  HYDROcodone-acetaminophen (NORCO) 5-325 MG tablet Take 1 tablet by mouth every 6 (six) hours as needed. 12/27/20  Yes Tommy Goostree, Dannielle Karvonen, PA-C  metroNIDAZOLE (FLAGYL) 500 MG tablet Take 1 tablet (500 mg total) by mouth 3 (three) times daily for 10 days. 12/27/20 01/06/21 Yes Zay Yeargan, Dannielle Karvonen, PA-C  ondansetron (ZOFRAN) 4 MG tablet Take 1 tablet (4 mg total) by mouth every 8 (eight) hours as needed for nausea or vomiting. 12/27/20  Yes Wheeler Incorvaia, Dannielle Karvonen, PA-C  acetaminophen (TYLENOL) 650 MG CR tablet Take 650 mg by mouth daily.    [provider]  aspirin EC 81 MG tablet Take 81 mg by mouth at bedtime.    [provider]  bisoprolol-hydrochlorothiazide (ZIAC) 5-6.25 MG tablet Take 1 tablet by mouth daily. 05/02/15   [provider]  Cholecalciferol (VITAMIN D-3 PO) Take 1 tablet by mouth every morning.    [provider]  fexofenadine-pseudoephedrine (ALLEGRA-D ALLERGY & CONGESTION) 180-240 MG 24 hr tablet Take 1 tablet by mouth daily. 09/02/15   Frederich Cha, MD  levothyroxine (SYNTHROID, LEVOTHROID) 100 MCG tablet Take 100 mcg by mouth daily before breakfast. 05/02/15   [provider]  meclizine (ANTIVERT) 12.5 MG tablet Take 12.5 mg by mouth daily as needed for dizziness.    [provider]  Omega-3 Fatty Acids (FISH OIL) 1000 MG CAPS Take 1,000 mg by mouth 2 (two) times daily.    [provider]  pantoprazole (PROTONIX) 40 MG tablet Take 40 mg by mouth every morning. 05/02/15  [provider]  primidone (MYSOLINE) 50 MG tablet Take 50 mg by mouth 2 (two) times daily. 04/21/15   [provider]  simvastatin (ZOCOR) 80 MG tablet Take 80 mg by mouth at bedtime. 05/02/15   [provider]  vitamin B-12 (CYANOCOBALAMIN) 1000 MCG tablet Take 1,000 mcg by mouth daily.    [provider]  vitamin E 1000 UNIT capsule Take 1,000 Units by mouth daily.    [provider]     Allergies Patient has no known allergies.  No family history on file.  Social History Social History   Tobacco Use   Smoking status: Never   Smokeless tobacco: Never  Substance Use Topics   Alcohol use: No   Drug use: No    Review of Systems  Constitutional: No fever/chills Eyes: No visual changes. ENT: No sore throat. Cardiovascular: Denies chest pain. Respiratory: Denies shortness of breath. Gastrointestinal: Reports lower abdominal pain.  Reports nausea, vomiting, and diarrhea.  No constipation. Genitourinary: Negative for dysuria. Musculoskeletal: Negative for back pain. Skin: Negative for rash. Neurological: Negative for headaches, focal weakness or numbness. ____________________________________________   PHYSICAL EXAM:  VITAL SIGNS: ED Triage Vitals [12/27/20 1234]  Enc Vitals Group     BP 128/77     Pulse Rate 75     Resp 17     Temp 98.3 F (36.8 C)     Temp Source Oral     SpO2 100 %     Weight 121 lb 4.1 oz (55 kg)     Height 5\' 6"  (1.676 m)     Head Circumference      Peak Flow      Pain Score 8     Pain Loc      Pain Edu?      Excl. in [place]?     Constitutional: Alert and oriented. Well appearing and in no acute distress. Eyes: Conjunctivae are normal. PERRL. EOMI. Head: Atraumatic. Nose: No congestion/rhinnorhea. Mouth/Throat: Mucous membranes are moist.  Oropharynx non-erythematous. Neck: No stridor.   Cardiovascular: Normal rate, regular rhythm. Grossly normal heart sounds.  Good peripheral circulation. Respiratory: Normal respiratory effort.  No retractions. Lungs CTAB. Gastrointestinal: Soft and mildly tender to palp over the lower left abdominal region. No distention. No abdominal bruits. No CVA tenderness. Musculoskeletal: No lower extremity tenderness nor edema.  No joint effusions. Neurologic:  Normal speech and language. No gross focal neurologic deficits are appreciated. No gait instability. Skin:  Skin is warm, dry and intact.  No rash noted. Psychiatric: Mood and affect are normal. Speech and behavior are normal.  ____________________________________________   LABS (all labs ordered are listed, but only abnormal results are displayed)  Labs Reviewed  CBC WITH DIFFERENTIAL/PLATELET - Abnormal; Notable for the following components:      Result Value   RBC 3.79 (*)    MCV 101.6 (*)    MCH 34.8 (*)    All other components within normal limits  URINALYSIS, COMPLETE (UACMP) WITH MICROSCOPIC - Abnormal; Notable for the following components:   Color, Urine YELLOW (*)    APPearance HAZY (*)    Ketones, ur 20 (*)    Leukocytes,Ua LARGE (*)    Bacteria, UA RARE (*)    All other components within normal limits  COMPREHENSIVE METABOLIC PANEL  LIPASE, BLOOD   ____________________________________________  EKG  Sinus rhythm  Vent. rate 67 BPM PR interval 176 ms QRS duration 100 ms QT/QTcB 399/422 ms P-R-T axes 22 -1 1 No STEMI ____________________________________________  RADIOLOGY I, Alita Chyle Bacon-Desta Bujak, personally viewed and evaluated these images (plain radiographs) as part of my medical decision making, as well as reviewing the written report by the radiologist.  ED MD interpretation:  agree with report  Official radiology report(s): CT ABDOMEN PELVIS WO CONTRAST  Result Date: 12/27/2020 CLINICAL DATA:  Diarrhea, nausea, pain EXAM: CT ABDOMEN AND PELVIS WITHOUT CONTRAST TECHNIQUE: Multidetector CT imaging of the abdomen and pelvis was performed following the standard protocol without IV contrast. COMPARISON:  None. FINDINGS: Lower chest: No pleural or pericardial effusion. Moderate hiatal hernia involving the gastric fundus. Hepatobiliary: Gallbladder is physiologically distended. No focal liver lesion, biliary ductal dilatation, or calcified gallstones evident. Pancreas: Unremarkable. No pancreatic ductal dilatation or surrounding inflammatory changes. Spleen: Normal in size without focal abnormality.  Adrenals/Urinary Tract: Adrenal glands are unremarkable. Kidneys are normal, without renal calculi, focal lesion, or hydronephrosis. Bladder is unremarkable. Stomach/Bowel: Moderate hiatal hernia involving the gastric fundus. The stomach is nondilated. The small bowel is decompressed. Normal appendix. Moderate proximal colonic fecal material without dilatation. Scattered diverticula in the sigmoid segment with mild adjacent inflammatory/edematous change. No convincing extraluminal gas or drainable abscess. Vascular/Lymphatic: Scattered aortic and iliofemoral calcified arterial plaque without aneurysm. No abdominal or pelvic adenopathy. Reproductive: Status post hysterectomy. No adnexal masses. Other: No ascites.  No free air. Musculoskeletal: Degenerative disc disease, and advanced facet DJD L4-5 which likely accounts for the grade 1 anterolisthesis. No fracture or worrisome bone lesion. IMPRESSION: 1. Sigmoid diverticulitis without abscess. 2. Moderate hiatal hernia 3.  Aortic Atherosclerosis (ICD10-170.0). Electronically Signed   By: Lucrezia Europe M.D.   On: 12/27/2020 14:11    ____________________________________________   PROCEDURES  Procedure(s) performed (including Critical Care):  Procedures  Zofran 4 mg IVP NS 1000 ml bolus IVP Reglan 10 mg IVP Morphine 2 mg IVP ____________________________________________   INITIAL IMPRESSION / ASSESSMENT AND PLAN / ED COURSE  As part of my medical decision making, I reviewed the following data within the [place] reviewed WNL, EKG interpreted NSR, Radiograph reviewed as noted, and Notes from prior ED visits  Differential diagnosis includes, but is not limited to, ovarian cyst, ovarian torsion, acute appendicitis, diverticulitis, urinary tract infection/pyelonephritis, endometriosis, bowel obstruction, colitis, renal colic, gastroenteritis, hernia, fibroids, endometriosis, pregnancy related pain including ectopic pregnancy,  etc.   Geriatric patient ED evaluation of a 1 week complaint of intermittent diarrhea and lower abdominal pain.  Patient was evaluated for complaints in the ED, and found to have normal and reassuring labs.  She is been stable throughout her course in the ED without any signs of hypotension or acute sepsis.  CT did reveal some changes to the sigmoid colon consistent with diverticulitis.  No frank abscess or perforation is appreciated.  Patient is reporting significant provement of her symptoms after fluid bolus, IV pain and nausea meds.  She is stable at this time for outpatient management of her acute diverticulitis.  She will discharge with a prescription for pain medicines, antiemetics, Cipro, metronidazole.  She will follow-up with outpatient GI medicine for ongoing management.  She may advance her diet as tolerated.  ____________________________________________   FINAL CLINICAL IMPRESSION(S) / ED DIAGNOSES  Final diagnoses:  Lower abdominal pain  Diverticulitis     ED Discharge Orders          Ordered    ciprofloxacin (CIPRO) 500 MG tablet  2 times daily        12/27/20 1535    metroNIDAZOLE (FLAGYL) 500 MG tablet  3  times daily        12/27/20 1535    ondansetron (ZOFRAN) 4 MG tablet  Every 8 hours PRN        12/27/20 1535    HYDROcodone-acetaminophen (NORCO) 5-325 MG tablet  Every 6 hours PRN        12/27/20 1537             Note:  This document was prepared using Dragon voice recognition software and may include unintentional dictation errors.    Melvenia Needles, PA-C 12/27/20 1544    Nance Pear, MD 12/28/20 1121

## 2021-03-21 ENCOUNTER — Other Ambulatory Visit: Payer: Self-pay | Admitting: Gastroenterology

## 2021-03-21 ENCOUNTER — Other Ambulatory Visit (HOSPITAL_COMMUNITY): Payer: Self-pay | Admitting: Gastroenterology

## 2021-03-21 DIAGNOSIS — R110 Nausea: Secondary | ICD-10-CM

## 2021-03-21 DIAGNOSIS — K219 Gastro-esophageal reflux disease without esophagitis: Secondary | ICD-10-CM

## 2021-03-29 ENCOUNTER — Ambulatory Visit
Admission: RE | Admit: 2021-03-29 | Discharge: 2021-03-29 | Disposition: A | Discharge status: Home / Self Care | Payer: Medicare Other | Source: Ambulatory Visit | Attending: Gastroenterology | Admitting: Gastroenterology

## 2021-03-29 DIAGNOSIS — R110 Nausea: Secondary | ICD-10-CM | POA: Diagnosis present

## 2021-03-29 DIAGNOSIS — K219 Gastro-esophageal reflux disease without esophagitis: Secondary | ICD-10-CM | POA: Diagnosis present

## 2021-07-12 ENCOUNTER — Encounter: Payer: Self-pay | Admitting: *Deleted

## 2021-07-15 ENCOUNTER — Encounter
Admission: RE | Disposition: A | Discharge status: Home / Self Care | Payer: Self-pay | Source: Ambulatory Visit | Attending: Gastroenterology

## 2021-07-15 ENCOUNTER — Ambulatory Visit: Payer: Medicare Other | Admitting: Anesthesiology

## 2021-07-15 ENCOUNTER — Ambulatory Visit
Admission: RE | Admit: 2021-07-15 | Discharge: 2021-07-15 | Disposition: A | Discharge status: Home / Self Care | Payer: Medicare Other | Source: Ambulatory Visit | Attending: Gastroenterology | Admitting: Gastroenterology

## 2021-07-15 ENCOUNTER — Encounter: Payer: Self-pay | Admitting: *Deleted

## 2021-07-15 ENCOUNTER — Other Ambulatory Visit: Payer: Self-pay

## 2021-07-15 DIAGNOSIS — K3189 Other diseases of stomach and duodenum: Secondary | ICD-10-CM | POA: Diagnosis not present

## 2021-07-15 DIAGNOSIS — I69354 Hemiplegia and hemiparesis following cerebral infarction affecting left non-dominant side: Secondary | ICD-10-CM | POA: Insufficient documentation

## 2021-07-15 DIAGNOSIS — Q399 Congenital malformation of esophagus, unspecified: Secondary | ICD-10-CM | POA: Diagnosis not present

## 2021-07-15 DIAGNOSIS — Z79899 Other long term (current) drug therapy: Secondary | ICD-10-CM | POA: Insufficient documentation

## 2021-07-15 DIAGNOSIS — Z79890 Hormone replacement therapy (postmenopausal): Secondary | ICD-10-CM | POA: Insufficient documentation

## 2021-07-15 DIAGNOSIS — K449 Diaphragmatic hernia without obstruction or gangrene: Secondary | ICD-10-CM | POA: Insufficient documentation

## 2021-07-15 DIAGNOSIS — I2510 Atherosclerotic heart disease of native coronary artery without angina pectoris: Secondary | ICD-10-CM | POA: Diagnosis not present

## 2021-07-15 DIAGNOSIS — I10 Essential (primary) hypertension: Secondary | ICD-10-CM | POA: Insufficient documentation

## 2021-07-15 DIAGNOSIS — K219 Gastro-esophageal reflux disease without esophagitis: Secondary | ICD-10-CM | POA: Diagnosis not present

## 2021-07-15 DIAGNOSIS — K319 Disease of stomach and duodenum, unspecified: Secondary | ICD-10-CM | POA: Diagnosis not present

## 2021-07-15 DIAGNOSIS — E039 Hypothyroidism, unspecified: Secondary | ICD-10-CM | POA: Diagnosis not present

## 2021-07-15 DIAGNOSIS — R1310 Dysphagia, unspecified: Secondary | ICD-10-CM | POA: Diagnosis present

## 2021-07-15 HISTORY — DX: Cerebral infarction, unspecified: I63.9

## 2021-07-15 HISTORY — DX: Aneurysm of heart: I25.3

## 2021-07-15 HISTORY — DX: Atherosclerotic heart disease of native coronary artery without angina pectoris: I25.10

## 2021-07-15 HISTORY — PX: ESOPHAGOGASTRODUODENOSCOPY (EGD) WITH PROPOFOL: SHX5813

## 2021-07-15 HISTORY — DX: Sleep disorder, unspecified: G47.9

## 2021-07-15 HISTORY — DX: Hypothyroidism, unspecified: E03.9

## 2021-07-15 HISTORY — DX: Unspecified hemorrhoids: K64.9

## 2021-07-15 SURGERY — ESOPHAGOGASTRODUODENOSCOPY (EGD) WITH PROPOFOL
Anesthesia: General

## 2021-07-15 MED ORDER — PROPOFOL 500 MG/50ML IV EMUL
INTRAVENOUS | Status: DC | PRN
  Administered 2021-07-15: 200 ug/kg/min via INTRAVENOUS

## 2021-07-15 MED ORDER — SODIUM CHLORIDE 0.9 % IV SOLN: INTRAVENOUS | Status: DC

## 2021-07-15 MED ORDER — PROPOFOL 10 MG/ML IV BOLUS
INTRAVENOUS | Status: DC | PRN
  Administered 2021-07-15: 60 mg via INTRAVENOUS

## 2021-07-15 MED ORDER — LIDOCAINE 2% (20 MG/ML) 5 ML SYRINGE
INTRAMUSCULAR | Status: DC | PRN
Start: 2021-07-15 — End: 2021-07-15
  Administered 2021-07-15: 100 mg via INTRAVENOUS

## 2021-07-15 NOTE — Interval H&P Note (Signed)
History and Physical Interval Note:  07/15/2021 8:28 AM  Janet Spears  has presented today for surgery, with the diagnosis of DYSPEPSIA.  The various methods of treatment have been discussed with the patient and family. After consideration of risks, benefits and other options for treatment, the patient has consented to  Procedure(s): ESOPHAGOGASTRODUODENOSCOPY (EGD) WITH PROPOFOL (N/A) as a surgical intervention.  The patient's history has been reviewed, patient examined, no change in status, stable for surgery.  I have reviewed the patient's chart and labs.  Questions were answered to the patient's satisfaction.     [name]  Ok to proceed with EGD

## 2021-07-15 NOTE — Transfer of Care (Signed)
Immediate Anesthesia Transfer of Care Note  Patient: Janet Spears  Procedure(s) Performed: ESOPHAGOGASTRODUODENOSCOPY (EGD) WITH PROPOFOL  Patient Location: Endoscopy Unit  Anesthesia Type:General  Level of Consciousness: drowsy  Airway & Oxygen Therapy: Patient Spontanous Breathing  Post-op Assessment: Report given to RN and Post -op Vital signs reviewed and stable  Post vital signs: Reviewed and stable  Last Vitals:  Vitals Value Taken Time  BP 132/65 07/15/21 0847  Temp 35.8 C 07/15/21 0847  Pulse 77 07/15/21 0847  Resp 13 07/15/21 0847  SpO2 93 % 07/15/21 0847  Vitals shown include unvalidated device data.  Last Pain:  Vitals:   07/15/21 0847  TempSrc: Tympanic  PainSc: Asleep         Complications: No notable events documented.

## 2021-07-15 NOTE — Anesthesia Postprocedure Evaluation (Signed)
Anesthesia Post Note  Patient: Janet Spears  Procedure(s) Performed: ESOPHAGOGASTRODUODENOSCOPY (EGD) WITH PROPOFOL  Patient location during evaluation: PACU Anesthesia Type: General Level of consciousness: awake and alert, oriented and patient cooperative Pain management: pain level controlled Vital Signs Assessment: post-procedure vital signs reviewed and stable Respiratory status: spontaneous breathing, nonlabored ventilation and respiratory function stable Cardiovascular status: blood pressure returned to baseline and stable Postop Assessment: adequate PO intake Anesthetic complications: no   No notable events documented.   Last Vitals:  Vitals:   07/15/21 0907 07/15/21 0917  BP: 121/83 (!) 149/78  Pulse: 78 75  Resp: 11 15  Temp:    SpO2: 99% 100%    Last Pain:  Vitals:   07/15/21 0917  TempSrc:   PainSc: 0-No pain                 Darrin Nipper

## 2021-07-15 NOTE — H&P (Signed)
Outpatient short stay form Pre-procedure 07/15/2021  [name], MD  Primary Physician: Sofie Hartigan, MD  Reason for visit:  Dysphagia  History of present illness:   86 y/o lady with hypertension, GERD, and HLD here for dysphagia to solids and liquids. Barium swallow with esophageal dysmotility. Liquids can give her the most problems. No family history of GI malignancies. No blood thinners. No neck surgeries.    Current Facility-Administered Medications:    0.9 %  sodium chloride infusion, , Intravenous, Continuous, Darryll Raju, Hilton Cork, MD, Last Rate: 20 mL/hr at 07/15/21 0824, Continued from Pre-op at 07/15/21 0824  Medications Prior to Admission  Medication Sig Dispense Refill Last Dose   acetaminophen (TYLENOL) 650 MG CR tablet Take 650 mg by mouth daily.   07/14/2021   aspirin EC 81 MG tablet Take 81 mg by mouth at bedtime.   07/14/2021   bisoprolol-hydrochlorothiazide (ZIAC) 5-6.25 MG tablet Take 1 tablet by mouth daily.  3 07/15/2021   Cholecalciferol (VITAMIN D-3 PO) Take 1 tablet by mouth every morning.   07/14/2021   levothyroxine (SYNTHROID, LEVOTHROID) 100 MCG tablet Take 100 mcg by mouth daily before breakfast.  2 07/14/2021   Omega-3 Fatty Acids (FISH OIL) 1000 MG CAPS Take 1,000 mg by mouth 2 (two) times daily.   07/14/2021   pantoprazole (PROTONIX) 40 MG tablet Take 40 mg by mouth every morning.  3 07/14/2021   primidone (MYSOLINE) 50 MG tablet Take 50 mg by mouth 2 (two) times daily.  0 07/14/2021   simvastatin (ZOCOR) 80 MG tablet Take 80 mg by mouth at bedtime.  3 07/14/2021   vitamin B-12 (CYANOCOBALAMIN) 1000 MCG tablet Take 1,000 mcg by mouth daily.   07/14/2021   vitamin E 1000 UNIT capsule Take 1,000 Units by mouth daily.   07/14/2021   ciprofloxacin (CIPRO) 500 MG tablet Take 1 tablet (500 mg total) by mouth 2 (two) times daily. (Patient not taking: Reported on 07/12/2021) 20 tablet 0 Completed Course   fexofenadine-pseudoephedrine (ALLEGRA-D ALLERGY &  CONGESTION) 180-240 MG 24 hr tablet Take 1 tablet by mouth daily. 30 tablet 0    HYDROcodone-acetaminophen (NORCO) 5-325 MG tablet Take 1 tablet by mouth every 6 (six) hours as needed. 10 tablet 0    meclizine (ANTIVERT) 12.5 MG tablet Take 12.5 mg by mouth daily as needed for dizziness.      ondansetron (ZOFRAN) 4 MG tablet Take 1 tablet (4 mg total) by mouth every 8 (eight) hours as needed for nausea or vomiting. 20 tablet 0      Allergies  Allergen Reactions   Ceftin [Cefuroxime] Hives and Shortness Of Breath   Amitiza [Lubiprostone] Other (See Comments)   Lisinopril Other (See Comments)     Past Medical History:  Diagnosis Date   Aneurysm of heart wall    Coronary artery disease    GERD (gastroesophageal reflux disease)    Heart disease    Hemorrhoids    Hypercholesteremia    Hypertension    Hypothyroidism    Sleeping difficulties    Stroke [facility])    Thyroid disease     Review of systems:  Otherwise negative.    Physical Exam  Gen: Alert, oriented. Appears stated age.  HEENT: PERRLA. Lungs: No respiratory distress CV: RRR Abd: soft, benign, no masses Ext: No edema    Planned procedures: Proceed with EGD. The patient understands the nature of the planned procedure, indications, risks, alternatives and potential complications including but not limited to bleeding, infection, perforation, damage  to internal organs and possible oversedation/side effects from anesthesia. The patient agrees and gives consent to proceed.  Please refer to procedure notes for findings, recommendations and patient disposition/instructions.     [name], MD [facility]

## 2021-07-15 NOTE — Op Note (Signed)
[facility] Gastroenterology Patient Name: Janet Spears Procedure Date: 07/15/2021 8:13 AM MRN: [id number] Account #: [id number] Date of Birth: 03/03/1936 Admit Type: Outpatient Age: 86 Room: [facility] ENDO ROOM 3 Gender: Female Note Status: Finalized Instrument Name: Upper Endoscope [id number] Procedure:             Upper GI endoscopy Indications:           Dysphagia Providers:             Andrey Farmer MD, MD Referring MD:          Sofie Hartigan (Referring MD) Medicines:             Monitored Anesthesia Care Complications:         No immediate complications. Estimated blood loss:                         Minimal. Procedure:             Pre-Anesthesia Assessment:                        - Prior to the procedure, a History and Physical was                         performed, and patient medications and allergies were                         reviewed. The patient is competent. The risks and                         benefits of the procedure and the sedation options and                         risks were discussed with the patient. All questions                         were answered and informed consent was obtained.                         Patient identification and proposed procedure were                         verified by the physician, the nurse, the                         anesthesiologist, the anesthetist and the technician                         in the endoscopy suite. Mental Status Examination:                         alert and oriented. Airway Examination: normal                         oropharyngeal airway and neck mobility. Respiratory                         Examination: clear to auscultation. CV Examination:  normal. Prophylactic Antibiotics: The patient does not                         require prophylactic antibiotics. Prior                         Anticoagulants: The patient has taken no previous                          anticoagulant or antiplatelet agents. ASA Grade                         Assessment: II - A patient with mild systemic disease.                         After reviewing the risks and benefits, the patient                         was deemed in satisfactory condition to undergo the                         procedure. The anesthesia plan was to use monitored                         anesthesia care (MAC). Immediately prior to                         administration of medications, the patient was                         re-assessed for adequacy to receive sedatives. The                         heart rate, respiratory rate, oxygen saturations,                         blood pressure, adequacy of pulmonary ventilation, and                         response to care were monitored throughout the                         procedure. The physical status of the patient was                         re-assessed after the procedure.                        After obtaining informed consent, the endoscope was                         passed under direct vision. Throughout the procedure,                         the patient's blood pressure, pulse, and oxygen                         saturations were monitored continuously. The Endoscope  was introduced through the mouth, and advanced to the                         second part of duodenum. The upper GI endoscopy was                         accomplished without difficulty. The patient tolerated                         the procedure well. Findings:      A 3 cm hiatal hernia was present.      Normal mucosa was found in the entire esophagus. Biopsies were obtained       from the proximal and distal esophagus with cold forceps for histology       of suspected eosinophilic esophagitis. Estimated blood loss was minimal.      The examined esophagus was mildly tortuous.      Localized mild mucosal changes characterized by altered texture were       found  at the gastroesophageal junction. Biopsies were taken with a cold       forceps for histology. Estimated blood loss was minimal.      A single small sessile fundic gland polyp with no bleeding and no       stigmata of recent bleeding was found in the cardia.      The exam of the stomach was otherwise normal.      The examined duodenum was normal. Impression:            - 3 cm hiatal hernia.                        - Normal mucosa was found in the entire esophagus.                         Biopsied.                        - Tortuous esophagus.                        - Texture changed mucosa in the gastroesophageal                         junction. Biopsied.                        - A single fundic gland polyp.                        - Normal examined duodenum. Recommendation:        - Discharge patient to home.                        - Resume previous diet.                        - Continue present medications.                        - Await pathology results.                        -  Return to referring physician as previously                         scheduled. Procedure Code(s):     --- Professional ---                        [phone], Esophagogastroduodenoscopy, flexible,                         transoral; with biopsy, single or multiple Diagnosis Code(s):     --- Professional ---                        K44.9, Diaphragmatic hernia without obstruction or                         gangrene                        Q39.9, Congenital malformation of esophagus,                         unspecified                        K31.89, Other diseases of stomach and duodenum                        K31.7, Polyp of stomach and duodenum                        R13.10, Dysphagia, unspecified CPT copyright 2019 American [employer] Association. All rights reserved. The codes documented in this report are preliminary and upon coder review may  be revised to meet current compliance requirements. Andrey Farmer MD,  MD 07/15/2021 8:49:43 AM Number of Addenda: 0 Note Initiated On: 07/15/2021 8:13 AM Estimated Blood Loss:  Estimated blood loss was minimal.      [facility]

## 2021-07-15 NOTE — Anesthesia Preprocedure Evaluation (Addendum)
Anesthesia Evaluation  Patient identified by MRN, date of birth, ID band Patient awake    Reviewed: Allergy & Precautions, NPO status , Patient's Chart, lab work & pertinent test results  History of Anesthesia Complications Negative for: history of anesthetic complications  Airway Mallampati: IV   Neck ROM: Full    Dental no notable dental hx.    Pulmonary neg pulmonary ROS,    Pulmonary exam normal breath sounds clear to auscultation       Cardiovascular hypertension, + CAD  Normal cardiovascular exam Rhythm:Regular Rate:Normal  ECG 12/27/20:  Sinus rhythm Low voltage, precordial leads Borderline T abnormalities, inferior leads  Echo 01/02/21:  NORMAL LEFT VENTRICULAR SYSTOLIC FUNCTION  NORMAL RIGHT VENTRICULAR SYSTOLIC FUNCTION  VALVULAR REGURGITATION: MILD MR, TRIVIAL PR, TRIVIAL TR  NO VALVULAR STENOSIS  NO PRIOR ECHO STUDY FOR COMPARISON  NEGATIVE SALINE CONTRAST STUDY   Loop recorder 01/03/21:  1. Study quality adequate for interpretation. 2. Manually and automatically detected events include episodes corresponding to sinus rhythm, some with baseline artifact, but regular suggesting more likely sinus rhythm than AFib.   Neuro/Psych CVA (12/2020, mild left hand weakness, almost back to baseline)    GI/Hepatic GERD  ,  Endo/Other  Hypothyroidism   Renal/GU negative Renal ROS     Musculoskeletal   Abdominal   Peds  Hematology negative hematology ROS (+)   Anesthesia Other Findings Reviewed 03/21/21 cardiology note.  Reproductive/Obstetrics                            Anesthesia Physical Anesthesia Plan  ASA: 2  Anesthesia Plan: General   Post-op Pain Management:    Induction: Intravenous  PONV Risk Score and Plan: 3 and Propofol infusion, TIVA and Treatment may vary due to age or medical condition  Airway Management Planned: Natural Airway  Additional Equipment:    Intra-op Plan:   Post-operative Plan:   Informed Consent: I have reviewed the patients History and Physical, chart, labs and discussed the procedure including the risks, benefits and alternatives for the proposed anesthesia with the patient or authorized representative who has indicated his/her understanding and acceptance.       Plan Discussed with: CRNA  Anesthesia Plan Comments: (LMA/GETA backup discussed.  Patient consented for risks of anesthesia including but not limited to:  - adverse reactions to medications - damage to eyes, teeth, lips or other oral mucosa - nerve damage due to positioning  - sore throat or hoarseness - damage to heart, brain, nerves, lungs, other parts of body or loss of life  Informed patient about role of CRNA in peri- and intra-operative care.  Patient voiced understanding.)        Anesthesia Quick Evaluation

## 2021-07-16 ENCOUNTER — Encounter: Payer: Self-pay | Admitting: Gastroenterology

## 2021-07-16 LAB — SURGICAL PATHOLOGY

## 2021-12-06 ENCOUNTER — Ambulatory Visit
Admission: EM | Admit: 2021-12-06 | Discharge: 2021-12-06 | Disposition: A | Discharge status: Home / Self Care | Payer: Medicare Other | Attending: Emergency Medicine | Admitting: Emergency Medicine

## 2021-12-06 ENCOUNTER — Ambulatory Visit (INDEPENDENT_AMBULATORY_CARE_PROVIDER_SITE_OTHER): Payer: Medicare Other

## 2021-12-06 DIAGNOSIS — L089 Local infection of the skin and subcutaneous tissue, unspecified: Secondary | ICD-10-CM

## 2021-12-06 DIAGNOSIS — T148XXA Other injury of unspecified body region, initial encounter: Secondary | ICD-10-CM

## 2021-12-06 DIAGNOSIS — M79662 Pain in left lower leg: Secondary | ICD-10-CM

## 2021-12-06 MED ORDER — DOXYCYCLINE HYCLATE 100 MG PO CAPS: 100.0000 mg | ORAL_CAPSULE | Freq: Two times a day (BID) | ORAL | 0 refills | Status: AC

## 2021-12-06 NOTE — Discharge Instructions (Addendum)
Your x-ray was negative for bone infection or fracture.  Finish the doxycycline, even if you feel better. continue keeping this clean with soap and water, discontinue peroxide, may take Tylenol 500 to 1000 mg 3-4 times a day as needed for pain.

## 2021-12-06 NOTE — ED Triage Notes (Signed)
Pt reports hitting her left lower leg x 2 weeks ago after a slip and fall.

## 2021-12-06 NOTE — ED Provider Notes (Signed)
HPI  SUBJECTIVE:  Janet Spears is a 86 y.o. female who presents with pain, increasing erythema, swelling surrounding an abrasion that she sustained 2 weeks ago on a stool.  States that she was stepping up, slipped, and scraped her distal left lower anterior extremity.  She denies body aches, fevers, nausea, vomiting, purulent drainage.  She reports serosanguineous drainage at first, but this has resolved.  She reports constant burning, sharp, dull, achy pain.  She has been trying to keep it clean with soap and water and peroxide, symptoms are better with elevation worse with weightbearing.  She has a past medical history of hypertension, hypercholesterolemia, coronary disease, TIA, hypothyroidism, GERD.  No history of diabetes, chronic kidney disease, MRSA.  PCP: [facility] [place].   Past Medical History:  Diagnosis Date   Aneurysm of heart wall    Coronary artery disease    GERD (gastroesophageal reflux disease)    Heart disease    Hemorrhoids    Hypercholesteremia    Hypertension    Hypothyroidism    Sleeping difficulties    Stroke [facility])    Thyroid disease     Past Surgical History:  Procedure Laterality Date   CARDIAC CATHETERIZATION     COLONOSCOPY     x2   COLONOSCOPY WITH PROPOFOL N/A 06/04/2015   Procedure: COLONOSCOPY WITH PROPOFOL;  Surgeon: Hulen Luster, MD;  Location: [facility] ENDOSCOPY;  Service: Gastroenterology;  Laterality: N/A;   ESOPHAGOGASTRODUODENOSCOPY (EGD) WITH PROPOFOL N/A 07/15/2021   Procedure: ESOPHAGOGASTRODUODENOSCOPY (EGD) WITH PROPOFOL;  Surgeon: [name], MD;  Location: [facility] ENDOSCOPY;  Service: Endoscopy;  Laterality: N/A;   FOOT SURGERY      History reviewed. No pertinent family history.  Social History   Tobacco Use   Smoking status: Never   Smokeless tobacco: Never  Vaping Use   Vaping Use: Never used  Substance Use Topics   Alcohol use: No   Drug use: No    No current facility-administered medications for this  encounter.  Current Outpatient Medications:    doxycycline (VIBRAMYCIN) 100 MG capsule, Take 1 capsule (100 mg total) by mouth 2 (two) times daily for 7 days., Disp: 14 capsule, Rfl: 0   acetaminophen (TYLENOL) 650 MG CR tablet, Take 650 mg by mouth daily., Disp: , Rfl:    aspirin EC 81 MG tablet, Take 81 mg by mouth at bedtime., Disp: , Rfl:    bisoprolol-hydrochlorothiazide (ZIAC) 5-6.25 MG tablet, Take 1 tablet by mouth daily., Disp: , Rfl: 3   Cholecalciferol (VITAMIN D-3 PO), Take 1 tablet by mouth every morning., Disp: , Rfl:    fexofenadine-pseudoephedrine (ALLEGRA-D ALLERGY & CONGESTION) 180-240 MG 24 hr tablet, Take 1 tablet by mouth daily., Disp: 30 tablet, Rfl: 0   levothyroxine (SYNTHROID, LEVOTHROID) 100 MCG tablet, Take 100 mcg by mouth daily before breakfast., Disp: , Rfl: 2   meclizine (ANTIVERT) 12.5 MG tablet, Take 12.5 mg by mouth daily as needed for dizziness., Disp: , Rfl:    Omega-3 Fatty Acids (FISH OIL) 1000 MG CAPS, Take 1,000 mg by mouth 2 (two) times daily., Disp: , Rfl:    ondansetron (ZOFRAN) 4 MG tablet, Take 1 tablet (4 mg total) by mouth every 8 (eight) hours as needed for nausea or vomiting., Disp: 20 tablet, Rfl: 0   pantoprazole (PROTONIX) 40 MG tablet, Take 40 mg by mouth every morning., Disp: , Rfl: 3   primidone (MYSOLINE) 50 MG tablet, Take 50 mg by mouth 2 (two) times daily., Disp: , Rfl:  0   simvastatin (ZOCOR) 80 MG tablet, Take 80 mg by mouth at bedtime., Disp: , Rfl: 3   vitamin B-12 (CYANOCOBALAMIN) 1000 MCG tablet, Take 1,000 mcg by mouth daily., Disp: , Rfl:    vitamin E 1000 UNIT capsule, Take 1,000 Units by mouth daily., Disp: , Rfl:   Allergies  Allergen Reactions   Ceftin [Cefuroxime] Hives and Shortness Of Breath   Amitiza [Lubiprostone] Other (See Comments)   Lisinopril Other (See Comments)     ROS  As noted in HPI.   Physical Exam  BP (!) 156/77 (BP Location: Left Arm)   Pulse 72   Temp 98.6 F (37 C) (Oral)   Resp 16   SpO2  96%   Constitutional: Well developed, well nourished, no acute distress Eyes:  EOMI, conjunctiva normal bilaterally HENT: Normocephalic, atraumatic,mucus membranes moist Respiratory: Normal inspiratory effort Cardiovascular: Normal rate GI: nondistended skin: 3.5 cm scab distal anterior left lower extremity with surrounding tenderness, erythema, increased temperature.  No expressible purulent drainage.  No subcutaneous air.  Positive swelling compared to other the leg.  Musculoskeletal: no deformities Neurologic: Alert & oriented x 3, no focal neuro deficits Psychiatric: Speech and behavior appropriate   ED Course   Medications - No data to display  Orders Placed This Encounter  Procedures   DG Tibia/Fibula Left    Standing Status:   Standing    Number of Occurrences:   1    Order Specific Question:   Reason for Exam (SYMPTOM  OR DIAGNOSIS REQUIRED)    Answer:   Infected abrasion distal anterior leg.  Rule out osteomyelitis.    No results found for this or any previous visit (from the past 24 hour(s)). DG Tibia/Fibula Left  Result Date: 12/06/2021 CLINICAL DATA:  86 year old female status post injury 2 weeks ago with unhealing wound lower 3rd Shin. Erythema. Query osteomyelitis. EXAM: LEFT TIBIA AND FIBULA - 2 VIEW COMPARISON:  None Available. FINDINGS: Maintained alignment at the left knee and ankle, joint space appear normal for age. Bone mineralization is within normal limits. No fracture or cortical osteolysis. No discrete skin wound is evident. IMPRESSION: Negative. Electronically Signed   By: Genevie Ann M.D.   On: 12/06/2021 11:25    ED Clinical Impression  1. Infected abrasion      ED Assessment/Plan   Reviewed imaging independently.  Normal.  No osteomyelitis.  See radiology report for full details.  Patient with an infected laceration/abrasion.  Home on doxycycline for 7 days.  She is to continue keeping this clean with soap and water, discontinue peroxide, may take  Tylenol 500 to 1000 mg 3-4 times a day as needed for pain.  Follow-up with PCP or here if not getting any better.  ER return precautions given  Discussed imaging, MDM, treatment plan, and plan for follow-up with patient. Discussed sn/sx that should prompt return to the ED. patient agrees with plan.   Meds ordered this encounter  Medications   doxycycline (VIBRAMYCIN) 100 MG capsule    Sig: Take 1 capsule (100 mg total) by mouth 2 (two) times daily for 7 days.    Dispense:  14 capsule    Refill:  0      *This clinic note was created using [employer]. Therefore, there may be occasional mistakes despite careful proofreading.  ?    Melynda Ripple, MD 12/06/21 1148

## 2022-06-22 ENCOUNTER — Ambulatory Visit
Admission: EM | Admit: 2022-06-22 | Discharge: 2022-06-22 | Disposition: A | Discharge status: Home / Self Care | Payer: Medicare Other | Attending: Family Medicine | Admitting: Family Medicine

## 2022-06-22 ENCOUNTER — Encounter: Payer: Self-pay | Admitting: Emergency Medicine

## 2022-06-22 DIAGNOSIS — J22 Unspecified acute lower respiratory infection: Secondary | ICD-10-CM | POA: Diagnosis present

## 2022-06-22 DIAGNOSIS — Z1152 Encounter for screening for COVID-19: Secondary | ICD-10-CM | POA: Diagnosis not present

## 2022-06-22 DIAGNOSIS — Z792 Long term (current) use of antibiotics: Secondary | ICD-10-CM | POA: Insufficient documentation

## 2022-06-22 DIAGNOSIS — Z7952 Long term (current) use of systemic steroids: Secondary | ICD-10-CM | POA: Insufficient documentation

## 2022-06-22 LAB — RESP PANEL BY RT-PCR (RSV, FLU A&B, COVID)  RVPGX2
Influenza A by PCR: NEGATIVE
Influenza B by PCR: NEGATIVE
Resp Syncytial Virus by PCR: NEGATIVE
SARS Coronavirus 2 by RT PCR: NEGATIVE

## 2022-06-22 MED ORDER — AZITHROMYCIN 250 MG PO TABS: 250.0000 mg | ORAL_TABLET | Freq: Every day | ORAL | 0 refills | Status: DC

## 2022-06-22 MED ORDER — PREDNISONE 50 MG PO TABS: 50.0000 mg | ORAL_TABLET | Freq: Every day | ORAL | 0 refills | Status: AC

## 2022-06-22 NOTE — ED Triage Notes (Signed)
Patient c/o cough, congestion, nasal congestion and bodyaches that started last Wed. Patient also reports diarrhea that lasted 2 days.

## 2022-06-22 NOTE — ED Provider Notes (Addendum)
[facility]    CSN: [id number] Arrival date & time: 06/22/22  1018      History   Chief Complaint Chief Complaint  Patient presents with   Cough   Nasal Congestion    HPI Janet Spears is a 86 y.o. female.   HPI   Janet Spears presents for dry cough, nasal congestion, sore throat, body aches that started Wednesday of last week.  She tried Tylenol and Nyquil with some relief.  No fevers but had chills.  Has diarrhea for 3 days. Has diverticulitis. She has been dry heaving. No abdominal pain. She has been more tired and intermittently short of breath.     Past Medical History:  Diagnosis Date   Aneurysm of heart wall    Coronary artery disease    GERD (gastroesophageal reflux disease)    Heart disease    Hemorrhoids    Hypercholesteremia    Hypertension    Hypothyroidism    Sleeping difficulties    Stroke [facility])    Thyroid disease     Patient Active Problem List   Diagnosis Date Noted   Chest pain 05/05/2015   Hyperlipidemia 05/05/2015   Vasovagal syncope 05/05/2015   Hypertension 05/05/2015   Hypothyroidism 05/05/2015   Gastroenteritis 05/05/2015    Past Surgical History:  Procedure Laterality Date   CARDIAC CATHETERIZATION     COLONOSCOPY     x2   COLONOSCOPY WITH PROPOFOL N/A 06/04/2015   Procedure: COLONOSCOPY WITH PROPOFOL;  Surgeon: Hulen Luster, MD;  Location: [facility] ENDOSCOPY;  Service: Gastroenterology;  Laterality: N/A;   ESOPHAGOGASTRODUODENOSCOPY (EGD) WITH PROPOFOL N/A 07/15/2021   Procedure: ESOPHAGOGASTRODUODENOSCOPY (EGD) WITH PROPOFOL;  Surgeon: [name], MD;  Location: [facility] ENDOSCOPY;  Service: Endoscopy;  Laterality: N/A;   FOOT SURGERY      OB History   No obstetric history on file.      Home Medications    Prior to Admission medications   Medication Sig Start Date End Date Taking? Authorizing Provider  azithromycin (ZITHROMAX Z-PAK) 250 MG tablet Take 1 tablet (250 mg total) by mouth daily. Take 2  tablets on day 1 06/22/22  Yes Jonnie Kubly, DO  predniSONE (DELTASONE) 50 MG tablet Take 1 tablet (50 mg total) by mouth daily for 5 days. 06/22/22 06/27/22 Yes Monque Haggar, Ronnette Juniper, DO  acetaminophen (TYLENOL) 650 MG CR tablet Take 650 mg by mouth daily.    [provider]  aspirin EC 81 MG tablet Take 81 mg by mouth at bedtime.    [provider]  bisoprolol-hydrochlorothiazide (ZIAC) 5-6.25 MG tablet Take 1 tablet by mouth daily. 05/02/15   [provider]  Cholecalciferol (VITAMIN D-3 PO) Take 1 tablet by mouth every morning.    [provider]  fexofenadine-pseudoephedrine (ALLEGRA-D ALLERGY & CONGESTION) 180-240 MG 24 hr tablet Take 1 tablet by mouth daily. 09/02/15   Frederich Cha, MD  levothyroxine (SYNTHROID, LEVOTHROID) 100 MCG tablet Take 100 mcg by mouth daily before breakfast. 05/02/15   [provider]  meclizine (ANTIVERT) 12.5 MG tablet Take 12.5 mg by mouth daily as needed for dizziness.    [provider]  Omega-3 Fatty Acids (FISH OIL) 1000 MG CAPS Take 1,000 mg by mouth 2 (two) times daily.    [provider]  ondansetron (ZOFRAN) 4 MG tablet Take 1 tablet (4 mg total) by mouth every 8 (eight) hours as needed for nausea or vomiting. 12/27/20   Menshew, Dannielle Karvonen, PA-C  pantoprazole (PROTONIX) 40 MG tablet Take  40 mg by mouth every morning. 05/02/15   [provider]  primidone (MYSOLINE) 50 MG tablet Take 50 mg by mouth 2 (two) times daily. 04/21/15   [provider]  simvastatin (ZOCOR) 80 MG tablet Take 80 mg by mouth at bedtime. 05/02/15   [provider]  vitamin B-12 (CYANOCOBALAMIN) 1000 MCG tablet Take 1,000 mcg by mouth daily.    [provider]  vitamin E 1000 UNIT capsule Take 1,000 Units by mouth daily.    [provider]    Family History History reviewed. No pertinent family history.  Social History Social History   Tobacco Use   Smoking status: Never    Smokeless tobacco: Never  Vaping Use   Vaping Use: Never used  Substance Use Topics   Alcohol use: No   Drug use: No     Allergies   Ceftin [cefuroxime], Amitiza [lubiprostone], and Lisinopril   Review of Systems Review of Systems: negative unless otherwise stated in HPI.      Physical Exam Triage Vital Signs ED Triage Vitals  Enc Vitals Group     BP 06/22/22 1149 (!) 152/68     Pulse Rate 06/22/22 1149 84     Resp 06/22/22 1149 15     Temp 06/22/22 1149 98 F (36.7 C)     Temp Source 06/22/22 1149 Oral     SpO2 06/22/22 1149 95 %     Weight 06/22/22 1146 138 lb 14.2 oz (63 kg)     Height 06/22/22 1146 '4\' 11"'$  (1.499 m)     Head Circumference --      Peak Flow --      Pain Score 06/22/22 1146 5     Pain Loc --      Pain Edu? --      Excl. in [place]? --    No data found.  Updated Vital Signs BP (!) 152/68 (BP Location: Left Arm)   Pulse 84   Temp 98 F (36.7 C) (Oral)   Resp 15   Ht '4\' 11"'$  (1.499 m)   Wt 63 kg   SpO2 95%   BMI 28.05 kg/m   Visual Acuity Right Eye Distance:   Left Eye Distance:   Bilateral Distance:    Right Eye Near:   Left Eye Near:    Bilateral Near:     Physical Exam GEN:     alert, non-toxic appearing female in no distress    HENT:  mucus membranes moist, oropharyngeal without lesions or exudate, no tonsillar hypertrophy, clear nasal discharge, bilateral TM normal EYES:   pupils equal and reactive, no scleral injection or discharge NECK:  ROM baseline, no lymphadenopathy, no meningismus   RESP:  no increased work of breathing, bilateral coarse breath sounds, no wheezing CVS:   regular rate and rhythm Skin:   warm and dry, no rash on visible skin    UC Treatments / Results  Labs (all labs ordered are listed, but only abnormal results are displayed) Labs Reviewed  RESP PANEL BY RT-PCR (RSV, FLU A&B, COVID)  [id number]    EKG   Radiology No results found.  Procedures Procedures (including critical care time)  Medications  Ordered in UC Medications - No data to display  Initial Impression / Assessment and Plan / UC Course  I have reviewed the triage vital signs and the nursing notes.  Pertinent labs & imaging results that were available during my care of the patient were reviewed by me and considered in  my medical decision making (see chart for details).       Pt is a 86 y.o. female who presents for ongoing flu like symptoms.  Janet Spears is afebrile here without recent antipyretics. Satting well on room air. Overall pt is non-toxic appearing, well hydrated, without respiratory distress. Pulmonary exam is remarkable for bilateral coarse breath sounds.  RSV, COVID and influenza testing obtained and was negative.  I worry that she may be developing pneumonia.  Offered a chest x-ray however she declined at this time.  Will treat with azithromycin and steroids with strict return precautions.  Discussed symptomatic treatment.  Typical duration of symptoms discussed.   Return and ED precautions given and voiced understanding. Discussed MDM, treatment plan and plan for follow-up with patient who agrees with plan.     Final Clinical Impressions(s) / UC Diagnoses   Final diagnoses:  Acute lower respiratory infection     Discharge Instructions      Your COVID, influenza and RSV tests were negative.   It is important to stay hydrated: drink plenty of fluids (water, gatorade/powerade/pedialyte, juices, or teas) to keep your throat moisturized and help further relieve irritation/discomfort.    Return or go to the Emergency Department if symptoms worsen or do not improve in the next few days      ED Prescriptions     Medication Sig Dispense Auth. Provider   azithromycin (ZITHROMAX Z-PAK) 250 MG tablet Take 1 tablet (250 mg total) by mouth daily. Take 2 tablets on day 1 6 tablet Aishia Barkey, DO   predniSONE (DELTASONE) 50 MG tablet Take 1 tablet (50 mg total) by mouth daily for 5 days. 5 tablet Lyndee Hensen, DO      PDMP not reviewed this encounter.      Lyndee Hensen, DO 06/22/22 1347

## 2022-06-22 NOTE — Discharge Instructions (Addendum)
Your COVID, influenza and RSV tests were negative.   It is important to stay hydrated: drink plenty of fluids (water, gatorade/powerade/pedialyte, juices, or teas) to keep your throat moisturized and help further relieve irritation/discomfort.    Return or go to the Emergency Department if symptoms worsen or do not improve in the next few days

## 2022-12-02 ENCOUNTER — Ambulatory Visit
Admission: EM | Admit: 2022-12-02 | Discharge: 2022-12-02 | Disposition: A | Discharge status: Home / Self Care | Payer: Medicare Other | Attending: Emergency Medicine | Admitting: Emergency Medicine

## 2022-12-02 DIAGNOSIS — S81811A Laceration without foreign body, right lower leg, initial encounter: Secondary | ICD-10-CM | POA: Diagnosis not present

## 2022-12-02 MED ORDER — DOXYCYCLINE HYCLATE 100 MG PO CAPS: 100.0000 mg | ORAL_CAPSULE | Freq: Two times a day (BID) | ORAL | 0 refills | Status: AC

## 2022-12-02 MED ORDER — TETANUS-DIPHTH-ACELL PERTUSSIS 5-2.5-18.5 LF-MCG/0.5 IM SUSY
0.5000 mL | PREFILLED_SYRINGE | Freq: Once | INTRAMUSCULAR | Status: AC
  Administered 2022-12-02: 0.5 mL via INTRAMUSCULAR

## 2022-12-02 NOTE — Discharge Instructions (Addendum)
Keep the wound on your right lower leg clean and dry.  I want you to apply bacitracin and a nonstick dressing twice daily until a scab has formed.  Once a scab is formed you can stop applying ointment and leave the area open to air while at home and cover with a dry dressing when you go out in public.  To prevent infection unguinal start her on some oral antibiotics called doxycycline.  Please take this medication twice daily with food for 7 days.  Be mindful that doxycycline will make you more prone to sunburns and make sure that you are wearing sunscreen if you go out in public.  If you develop any increased redness around the wound, the redness deepens, you develop red streaks up your leg, or fever you need to return for reevaluation.  Also if the wound starts draining pus you need to return for reevaluation or see your PCP.

## 2022-12-02 NOTE — ED Triage Notes (Signed)
Pt presents to UC for skin tear to RT lower leg, pt states she hit leg on car door yesterday morning, pt states she does take a daily aspirin.

## 2022-12-02 NOTE — ED Provider Notes (Signed)
[facility]    CSN: [id number] Arrival date & time: 12/02/22  0933      History   Chief Complaint Chief Complaint  Patient presents with   Laceration    HPI Tynita Sullens is a 87 y.o. female.   HPI  87 year old female with a past medical history significant for hypertension, hypothyroidism, GERD, heart disease, and CVA presents for evaluation of a skin tear to her right lower leg.  She reports that she struck her leg on a car door yesterday morning and she came in because the area was still bleeding this morning.  She does take a baby aspirin daily.  She is unsure when her last tetanus shot was.  Past Medical History:  Diagnosis Date   Aneurysm of heart wall    Coronary artery disease    GERD (gastroesophageal reflux disease)    Heart disease    Hemorrhoids    Hypercholesteremia    Hypertension    Hypothyroidism    Sleeping difficulties    Stroke [facility])    Thyroid disease     Patient Active Problem List   Diagnosis Date Noted   Chest pain 05/05/2015   Hyperlipidemia 05/05/2015   Vasovagal syncope 05/05/2015   Hypertension 05/05/2015   Hypothyroidism 05/05/2015   Gastroenteritis 05/05/2015    Past Surgical History:  Procedure Laterality Date   CARDIAC CATHETERIZATION     COLONOSCOPY     x2   COLONOSCOPY WITH PROPOFOL N/A 06/04/2015   Procedure: COLONOSCOPY WITH PROPOFOL;  Surgeon: Wallace Cullens, MD;  Location: [facility] ENDOSCOPY;  Service: Gastroenterology;  Laterality: N/A;   ESOPHAGOGASTRODUODENOSCOPY (EGD) WITH PROPOFOL N/A 07/15/2021   Procedure: ESOPHAGOGASTRODUODENOSCOPY (EGD) WITH PROPOFOL;  Surgeon: Regis Bill, MD;  Location: [facility] ENDOSCOPY;  Service: Endoscopy;  Laterality: N/A;   FOOT SURGERY      OB History   No obstetric history on file.      Home Medications    Prior to Admission medications   Medication Sig Start Date End Date Taking? Authorizing Provider  acetaminophen (TYLENOL) 650 MG CR tablet Take 650 mg by  mouth daily.   Yes [provider]  aspirin EC 81 MG tablet Take 81 mg by mouth at bedtime.   Yes [provider]  bisoprolol-hydrochlorothiazide (ZIAC) 5-6.25 MG tablet Take 1 tablet by mouth daily. 05/02/15  Yes [provider]  Cholecalciferol (VITAMIN D-3 PO) Take 1 tablet by mouth every morning.   Yes [provider]  doxycycline (VIBRAMYCIN) 100 MG capsule Take 1 capsule (100 mg total) by mouth 2 (two) times daily for 7 days. 12/02/22 12/09/22 Yes Becky Augusta, NP  fexofenadine-pseudoephedrine (ALLEGRA-D ALLERGY & CONGESTION) 180-240 MG 24 hr tablet Take 1 tablet by mouth daily. 09/02/15  Yes Hassan Rowan, MD  levothyroxine (SYNTHROID, LEVOTHROID) 100 MCG tablet Take 100 mcg by mouth daily before breakfast. 05/02/15  Yes [provider]  meclizine (ANTIVERT) 12.5 MG tablet Take 12.5 mg by mouth daily as needed for dizziness.   Yes [provider]  Omega-3 Fatty Acids (FISH OIL) 1000 MG CAPS Take 1,000 mg by mouth 2 (two) times daily.   Yes [provider]  ondansetron (ZOFRAN) 4 MG tablet Take 1 tablet (4 mg total) by mouth every 8 (eight) hours as needed for nausea or vomiting. 12/27/20  Yes Menshew, Charlesetta Ivory, PA-C  pantoprazole (PROTONIX) 40 MG tablet Take 40 mg by mouth every morning. 05/02/15  Yes [provider]  primidone (MYSOLINE) 50 MG tablet Take  50 mg by mouth 2 (two) times daily. 04/21/15  Yes [provider]  simvastatin (ZOCOR) 80 MG tablet Take 80 mg by mouth at bedtime. 05/02/15  Yes [provider]  vitamin B-12 (CYANOCOBALAMIN) 1000 MCG tablet Take 1,000 mcg by mouth daily.   Yes [provider]  vitamin E 1000 UNIT capsule Take 1,000 Units by mouth daily.   Yes [provider]    Family History No family history on file.  Social History Social History   Tobacco Use   Smoking status: Never   Smokeless tobacco: Never  Vaping Use   Vaping Use: Never used   Substance Use Topics   Alcohol use: No   Drug use: No     Allergies   Ceftin [cefuroxime], Cefuroxime axetil, Lubiprostone, and Lisinopril   Review of Systems Review of Systems  Constitutional:  Negative for fever.  Skin:  Positive for color change and wound.     Physical Exam Triage Vital Signs ED Triage Vitals  Enc Vitals Group     BP      Pulse      Resp      Temp      Temp src      SpO2      Weight      Height      Head Circumference      Peak Flow      Pain Score      Pain Loc      Pain Edu?      Excl. in [place]?    No data found.  Updated Vital Signs BP (!) 134/57 (BP Location: Left Arm)   Pulse 61   Temp 97.9 F (36.6 C) (Oral)   SpO2 97%   Visual Acuity Right Eye Distance:   Left Eye Distance:   Bilateral Distance:    Right Eye Near:   Left Eye Near:    Bilateral Near:     Physical Exam Vitals and nursing note reviewed.  Constitutional:      Appearance: Normal appearance. She is not ill-appearing.  Musculoskeletal:        General: Swelling, tenderness and signs of injury present. No deformity.  Skin:    General: Skin is warm and dry.     Capillary Refill: Capillary refill takes less than 2 seconds.     Findings: Bruising present.  Neurological:     General: No focal deficit present.     Mental Status: She is alert and oriented to person, place, and time.      UC Treatments / Results  Labs (all labs ordered are listed, but only abnormal results are displayed) Labs Reviewed - No data to display  EKG   Radiology No results found.  Procedures Procedures (including critical care time)  Medications Ordered in UC Medications  Tdap (BOOSTRIX) injection 0.5 mL (has no administration in time range)    Initial Impression / Assessment and Plan / UC Course  I have reviewed the triage vital signs and the nursing notes.  Pertinent labs & imaging results that were available during my care of the patient were reviewed by me and  considered in my medical decision making (see chart for details).   Patient is a pleasant, nontoxic-appearing 87 year old female presenting for evaluation of a skin tear to the right lower leg as outlined in HPI above.  As you can see in image above, there is a large skin tear but there is no active bleeding.  The edges  of the wound are mildly erythematous, which I believe is reactive secondary to injury as they are not hot.  There is no active bleeding from the wound.  The skin flap has retracted and adhered to the wound bed and is unable to be worked back into place.  The wound is greater than 24 hours old and I explained to the patient that it is not safe to attempt to repair the tissue defect with sutures given the prolonged duration since injury.  She states that she did not come yesterday because she does not like going to the doctor.  I will have staff gently cleanse the wound and apply bacitracin ointment and a nonstick dressing.  I have advised the patient to keep the wound clean and dry and to change her dressing at least twice daily unless it becomes soiled.  We will update her tetanus shot prior to discharge and I will discharge her home on prophylactic antibiotics.  She has allergies to cephalosporins so I will start her on doxycycline 100 mg twice daily for 7 days.  Return precautions reviewed with the patient.  Final Clinical Impressions(s) / UC Diagnoses   Final diagnoses:  Noninfected skin tear of leg, right, initial encounter     Discharge Instructions      Keep the wound on your right lower leg clean and dry.  I want you to apply bacitracin and a nonstick dressing twice daily until a scab has formed.  Once a scab is formed you can stop applying ointment and leave the area open to air while at home and cover with a dry dressing when you go out in public.  To prevent infection unguinal start her on some oral antibiotics called doxycycline.  Please take this medication twice daily  with food for 7 days.  Be mindful that doxycycline will make you more prone to sunburns and make sure that you are wearing sunscreen if you go out in public.  If you develop any increased redness around the wound, the redness deepens, you develop red streaks up your leg, or fever you need to return for reevaluation.  Also if the wound starts draining pus you need to return for reevaluation or see your PCP.     ED Prescriptions     Medication Sig Dispense Auth. Provider   doxycycline (VIBRAMYCIN) 100 MG capsule Take 1 capsule (100 mg total) by mouth 2 (two) times daily for 7 days. 14 capsule Becky Augusta, NP      PDMP not reviewed this encounter.   Becky Augusta, NP 12/02/22 1018

## 2023-03-12 ENCOUNTER — Ambulatory Visit
Admission: EM | Admit: 2023-03-12 | Discharge: 2023-03-12 | Disposition: A | Discharge status: Home / Self Care | Payer: Medicare Other | Attending: Emergency Medicine | Admitting: Emergency Medicine

## 2023-03-12 DIAGNOSIS — R059 Cough, unspecified: Secondary | ICD-10-CM | POA: Insufficient documentation

## 2023-03-12 DIAGNOSIS — J069 Acute upper respiratory infection, unspecified: Secondary | ICD-10-CM | POA: Insufficient documentation

## 2023-03-12 DIAGNOSIS — Z1152 Encounter for screening for COVID-19: Secondary | ICD-10-CM | POA: Diagnosis not present

## 2023-03-12 DIAGNOSIS — B9789 Other viral agents as the cause of diseases classified elsewhere: Secondary | ICD-10-CM | POA: Insufficient documentation

## 2023-03-12 LAB — SARS CORONAVIRUS 2 BY RT PCR: SARS Coronavirus 2 by RT PCR: NEGATIVE

## 2023-03-12 MED ORDER — PREDNISONE 20 MG PO TABS: 40.0000 mg | ORAL_TABLET | Freq: Every day | ORAL | 0 refills | Status: DC

## 2023-03-12 MED ORDER — BENZONATATE 100 MG PO CAPS: 100.0000 mg | ORAL_CAPSULE | Freq: Three times a day (TID) | ORAL | 0 refills | Status: DC

## 2023-03-12 MED ORDER — AZITHROMYCIN 250 MG PO TABS: 250.0000 mg | ORAL_TABLET | Freq: Every day | ORAL | 0 refills | Status: DC

## 2023-03-12 NOTE — Discharge Instructions (Addendum)
COVID testing is negative  Symptoms are most likely viral and should improve with time, begin azithromycin as directed, there is an antibiotic which will prevent symptoms from progressing to something more serious like a pneumonia  Begin prednisone every morning with food for 5 days, this will open and relax your airway and should calm shortness of breath and harshness of coughing  You may use Tessalon pill every 8 hours as needed to help calm coughing and for additional comfort  You may continue some NyQuil and Tylenol as needed, may attempt any of the following below additionally     You can take Tylenol and/or Ibuprofen as needed for fever reduction and pain relief.   For cough: honey 1/2 to 1 teaspoon (you can dilute the honey in water or another fluid).  You can also use guaifenesin and dextromethorphan for cough. You can use a humidifier for chest congestion and cough.  If you don't have a humidifier, you can sit in the bathroom with the hot shower running.      For sore throat: try warm salt water gargles, cepacol lozenges, throat spray, warm tea or water with lemon/honey, popsicles or ice, or OTC cold relief medicine for throat discomfort.   For congestion: take a daily anti-histamine like Zyrtec, Claritin, and a oral decongestant, such as pseudoephedrine.  You can also use Flonase 1-2 sprays in each nostril daily.   It is important to stay hydrated: drink plenty of fluids (water, gatorade/powerade/pedialyte, juices, or teas) to keep your throat moisturized and help further relieve irritation/discomfort.

## 2023-03-12 NOTE — ED Provider Notes (Signed)
[facility]    CSN: [id number] Arrival date & time: 03/12/23  5284      History   Chief Complaint Chief Complaint  Patient presents with   Cough   Shortness of Breath    HPI Janet Spears is a 87 y.o. female.   Patient presents for evaluation of a sore throat, nonproductive cough and shortness of breath with exertion beginning 5 days ago.  Known sick contact with similar symptoms.  Tolerating food and liquids.  Has attempted use of NyQuil and Tylenol which has been somewhat helpful.  Denies respiratory history, non-smoker.  Denies wheezing, chest pain or tightness, fever chills or bodyaches.  Past Medical History:  Diagnosis Date   Aneurysm of heart wall    Coronary artery disease    GERD (gastroesophageal reflux disease)    Heart disease    Hemorrhoids    Hypercholesteremia    Hypertension    Hypothyroidism    Sleeping difficulties    Stroke [facility])    Thyroid disease     Patient Active Problem List   Diagnosis Date Noted   Chest pain 05/05/2015   Hyperlipidemia 05/05/2015   Vasovagal syncope 05/05/2015   Hypertension 05/05/2015   Hypothyroidism 05/05/2015   Gastroenteritis 05/05/2015    Past Surgical History:  Procedure Laterality Date   CARDIAC CATHETERIZATION     COLONOSCOPY     x2   COLONOSCOPY WITH PROPOFOL N/A 06/04/2015   Procedure: COLONOSCOPY WITH PROPOFOL;  Surgeon: Wallace Cullens, MD;  Location: [facility] ENDOSCOPY;  Service: Gastroenterology;  Laterality: N/A;   ESOPHAGOGASTRODUODENOSCOPY (EGD) WITH PROPOFOL N/A 07/15/2021   Procedure: ESOPHAGOGASTRODUODENOSCOPY (EGD) WITH PROPOFOL;  Surgeon: Regis Bill, MD;  Location: [facility] ENDOSCOPY;  Service: Endoscopy;  Laterality: N/A;   FOOT SURGERY      OB History   No obstetric history on file.      Home Medications    Prior to Admission medications   Medication Sig Start Date End Date Taking? Authorizing Provider  acetaminophen (TYLENOL) 650 MG CR tablet Take 650 mg by mouth  daily.   Yes [provider]  aspirin EC 81 MG tablet Take 81 mg by mouth at bedtime.   Yes [provider]  bisoprolol-hydrochlorothiazide (ZIAC) 5-6.25 MG tablet Take 1 tablet by mouth daily. 05/02/15  Yes [provider]  Cholecalciferol (VITAMIN D-3 PO) Take 1 tablet by mouth every morning.   Yes [provider]  levothyroxine (SYNTHROID, LEVOTHROID) 100 MCG tablet Take 100 mcg by mouth daily before breakfast. 05/02/15  Yes [provider]  meclizine (ANTIVERT) 12.5 MG tablet Take 12.5 mg by mouth daily as needed for dizziness.   Yes [provider]  Omega-3 Fatty Acids (FISH OIL) 1000 MG CAPS Take 1,000 mg by mouth 2 (two) times daily.   Yes [provider]  ondansetron (ZOFRAN) 4 MG tablet Take 1 tablet (4 mg total) by mouth every 8 (eight) hours as needed for nausea or vomiting. 12/27/20  Yes Menshew, Charlesetta Ivory, PA-C  pantoprazole (PROTONIX) 40 MG tablet Take 40 mg by mouth every morning. 05/02/15  Yes [provider]  primidone (MYSOLINE) 50 MG tablet Take 50 mg by mouth 2 (two) times daily. 04/21/15  Yes [provider]  simvastatin (ZOCOR) 80 MG tablet Take 80 mg by mouth at bedtime. 05/02/15  Yes [provider]  vitamin B-12 (CYANOCOBALAMIN) 1000 MCG tablet Take 1,000 mcg by mouth daily.   Yes [provider]  vitamin E 1000 UNIT capsule  Take 1,000 Units by mouth daily.   Yes [provider]  fexofenadine-pseudoephedrine (ALLEGRA-D ALLERGY & CONGESTION) 180-240 MG 24 hr tablet Take 1 tablet by mouth daily. 09/02/15   Hassan Rowan, MD    Family History History reviewed. No pertinent family history.  Social History Social History   Tobacco Use   Smoking status: Never   Smokeless tobacco: Never  Vaping Use   Vaping status: Never Used  Substance Use Topics   Alcohol use: No   Drug use: No     Allergies   Ceftin [cefuroxime], Cefuroxime axetil, Lubiprostone, and  Lisinopril   Review of Systems Review of Systems  Constitutional: Negative.   HENT:  Positive for sore throat. Negative for congestion, dental problem, drooling, ear discharge, ear pain, facial swelling, hearing loss, mouth sores, nosebleeds, postnasal drip, rhinorrhea, sinus pressure, sinus pain, sneezing, tinnitus, trouble swallowing and voice change.   Respiratory:  Positive for cough and shortness of breath. Negative for apnea, choking, chest tightness, wheezing and stridor.   Cardiovascular: Negative.   Gastrointestinal: Negative.   Neurological: Negative.      Physical Exam Triage Vital Signs ED Triage Vitals  Encounter Vitals Group     BP 03/12/23 0959 135/69     Systolic BP Percentile --      Diastolic BP Percentile --      Pulse Rate 03/12/23 0959 65     Resp --      Temp 03/12/23 0959 98 F (36.7 C)     Temp Source 03/12/23 0959 Oral     SpO2 03/12/23 0959 100 %     Weight 03/12/23 0957 132 lb (59.9 kg)     Height 03/12/23 0957 4\' 11"  (1.499 m)     Head Circumference --      Peak Flow --      Pain Score 03/12/23 0957 0     Pain Loc --      Pain Education --      Exclude from Growth Chart --    No data found.  Updated Vital Signs BP 135/69 (BP Location: Left Arm)   Pulse 65   Temp 98 F (36.7 C) (Oral)   Ht 4\' 11"  (1.499 m)   Wt 132 lb (59.9 kg)   SpO2 100%   BMI 26.66 kg/m   Visual Acuity Right Eye Distance:   Left Eye Distance:   Bilateral Distance:    Right Eye Near:   Left Eye Near:    Bilateral Near:     Physical Exam Constitutional:      Appearance: Normal appearance.  HENT:     Right Ear: Tympanic membrane, ear canal and external ear normal.     Left Ear: Tympanic membrane, ear canal and external ear normal.     Nose: No congestion or rhinorrhea.     Mouth/Throat:     Pharynx: No oropharyngeal exudate or posterior oropharyngeal erythema.  Eyes:     Extraocular Movements: Extraocular movements intact.  Cardiovascular:     Rate and  Rhythm: Normal rate and regular rhythm.     Pulses: Normal pulses.     Heart sounds: Normal heart sounds.  Pulmonary:     Effort: Pulmonary effort is normal.     Breath sounds: Normal breath sounds.  Musculoskeletal:     Cervical back: Normal range of motion and neck supple.  Skin:    General: Skin is warm and dry.  Neurological:     Mental Status: She is alert and oriented  to person, place, and time. Mental status is at baseline.      UC Treatments / Results  Labs (all labs ordered are listed, but only abnormal results are displayed) Labs Reviewed  SARS CORONAVIRUS 2 BY RT PCR    EKG   Radiology No results found.  Procedures Procedures (including critical care time)  Medications Ordered in UC Medications - No data to display  Initial Impression / Assessment and Plan / UC Course  I have reviewed the triage vital signs and the nursing notes.  Pertinent labs & imaging results that were available during my care of the patient were reviewed by me and considered in my medical decision making (see chart for details).  Viral URI with cough  Patient is in no signs of distress nor toxic appearing.  Vital signs are stable.  Low suspicion for pneumonia, pneumothorax or bronchitis and therefore will defer imaging.  COVID testing negative.  Lungs are clear to auscultation and O2 saturation 100% on room air.  Prescribed azithromycin prophylactically, prednisone and Tessalon.May use additional over-the-counter medications as needed for supportive care.  May follow-up with urgent care as needed if symptoms persist or worsen.   Final Clinical Impressions(s) / UC Diagnoses   Final diagnoses:  None   Discharge Instructions   None    ED Prescriptions   None    PDMP not reviewed this encounter.   Valinda Hoar, [place] 03/12/23 [phone]

## 2023-03-12 NOTE — ED Triage Notes (Signed)
Pt c/o cough, sore throat, SOB x5days  Pt states that the symptoms worsened on yesterday and this morning.   Pt was around her great grandson who was sick but does not know what she has.

## 2023-07-10 ENCOUNTER — Other Ambulatory Visit: Payer: Self-pay | Admitting: Orthopedic Surgery

## 2023-07-10 DIAGNOSIS — M7552 Bursitis of left shoulder: Secondary | ICD-10-CM

## 2023-07-10 DIAGNOSIS — G8929 Other chronic pain: Secondary | ICD-10-CM

## 2023-07-10 DIAGNOSIS — M25312 Other instability, left shoulder: Secondary | ICD-10-CM

## 2023-07-20 ENCOUNTER — Ambulatory Visit
Admission: RE | Admit: 2023-07-20 | Discharge: 2023-07-20 | Discharge status: Home / Self Care | Payer: Medicare Other | Source: Ambulatory Visit | Attending: Orthopedic Surgery | Admitting: Orthopedic Surgery

## 2023-07-20 DIAGNOSIS — M7552 Bursitis of left shoulder: Secondary | ICD-10-CM

## 2023-07-20 DIAGNOSIS — M25312 Other instability, left shoulder: Secondary | ICD-10-CM

## 2023-07-20 DIAGNOSIS — G8929 Other chronic pain: Secondary | ICD-10-CM

## 2023-08-11 ENCOUNTER — Other Ambulatory Visit: Payer: Self-pay | Admitting: Orthopedic Surgery

## 2023-08-11 ENCOUNTER — Ambulatory Visit
Admission: RE | Admit: 2023-08-11 | Discharge: 2023-08-11 | Disposition: A | Discharge status: Home / Self Care | Payer: Medicare Other | Source: Ambulatory Visit | Attending: Orthopedic Surgery | Admitting: Orthopedic Surgery

## 2023-08-11 DIAGNOSIS — M75122 Complete rotator cuff tear or rupture of left shoulder, not specified as traumatic: Secondary | ICD-10-CM

## 2023-08-18 ENCOUNTER — Other Ambulatory Visit: Payer: Self-pay | Admitting: Orthopedic Surgery

## 2023-08-21 ENCOUNTER — Encounter
Admission: RE | Admit: 2023-08-21 | Discharge: 2023-08-21 | Disposition: A | Discharge status: Home / Self Care | Payer: Medicare Other | Source: Ambulatory Visit | Attending: Orthopedic Surgery | Admitting: Orthopedic Surgery

## 2023-08-21 VITALS — BP 159/69 | HR 79 | Resp 12 | Ht 59.0 in | Wt 132.0 lb

## 2023-08-21 DIAGNOSIS — Z01812 Encounter for preprocedural laboratory examination: Secondary | ICD-10-CM | POA: Diagnosis present

## 2023-08-21 DIAGNOSIS — Z01810 Encounter for preprocedural cardiovascular examination: Secondary | ICD-10-CM | POA: Diagnosis present

## 2023-08-21 DIAGNOSIS — Z01818 Encounter for other preprocedural examination: Secondary | ICD-10-CM | POA: Insufficient documentation

## 2023-08-21 HISTORY — DX: Transient cerebral ischemic attack, unspecified: G45.9

## 2023-08-21 HISTORY — DX: Polyp of colon: K63.5

## 2023-08-21 HISTORY — DX: Other specified postprocedural states: Z98.890

## 2023-08-21 HISTORY — DX: Vitamin D deficiency, unspecified: E55.9

## 2023-08-21 HISTORY — DX: Unspecified rotator cuff tear or rupture of left shoulder, not specified as traumatic: M75.102

## 2023-08-21 HISTORY — DX: Tremor, unspecified: R25.1

## 2023-08-21 HISTORY — DX: Benign paroxysmal vertigo, unspecified ear: H81.10

## 2023-08-21 HISTORY — DX: Unspecified osteoarthritis, unspecified site: M19.90

## 2023-08-21 LAB — URINALYSIS, ROUTINE W REFLEX MICROSCOPIC
Bilirubin Urine: NEGATIVE
Glucose, UA: NEGATIVE mg/dL
Hgb urine dipstick: NEGATIVE
Ketones, ur: NEGATIVE mg/dL
Leukocytes,Ua: NEGATIVE
Nitrite: NEGATIVE
Protein, ur: NEGATIVE mg/dL
Specific Gravity, Urine: 1.005 (ref 1.005–1.030)
pH: 6 (ref 5.0–8.0)

## 2023-08-21 LAB — CBC WITH DIFFERENTIAL/PLATELET
Abs Immature Granulocytes: 0.02 10*3/uL (ref 0.00–0.07)
Basophils Absolute: 0 10*3/uL (ref 0.0–0.1)
Basophils Relative: 1 %
Eosinophils Absolute: 0.2 10*3/uL (ref 0.0–0.5)
Eosinophils Relative: 2 %
HCT: 36.1 % (ref 36.0–46.0)
Hemoglobin: 12.4 g/dL (ref 12.0–15.0)
Immature Granulocytes: 0 %
Lymphocytes Relative: 27 %
Lymphs Abs: 2 10*3/uL (ref 0.7–4.0)
MCH: 34.3 pg — ABNORMAL HIGH (ref 26.0–34.0)
MCHC: 34.3 g/dL (ref 30.0–36.0)
MCV: 100 fL (ref 80.0–100.0)
Monocytes Absolute: 0.8 10*3/uL (ref 0.1–1.0)
Monocytes Relative: 11 %
Neutro Abs: 4.2 10*3/uL (ref 1.7–7.7)
Neutrophils Relative %: 59 %
Platelets: 234 10*3/uL (ref 150–400)
RBC: 3.61 MIL/uL — ABNORMAL LOW (ref 3.87–5.11)
RDW: 12.9 % (ref 11.5–15.5)
WBC: 7.2 10*3/uL (ref 4.0–10.5)
nRBC: 0 % (ref 0.0–0.2)

## 2023-08-21 LAB — COMPREHENSIVE METABOLIC PANEL
ALT: 14 U/L (ref 0–44)
AST: 17 U/L (ref 15–41)
Albumin: 4.1 g/dL (ref 3.5–5.0)
Alkaline Phosphatase: 55 U/L (ref 38–126)
Anion gap: 10 (ref 5–15)
BUN: 15 mg/dL (ref 8–23)
CO2: 24 mmol/L (ref 22–32)
Calcium: 9.4 mg/dL (ref 8.9–10.3)
Chloride: 102 mmol/L (ref 98–111)
Creatinine, Ser: 0.54 mg/dL (ref 0.44–1.00)
GFR, Estimated: >60 mL/min (ref >60)
Glucose, Bld: 89 mg/dL (ref 70–99)
Potassium: 3.9 mmol/L (ref 3.5–5.1)
Sodium: 136 mmol/L (ref 135–145)
Total Bilirubin: 0.3 mg/dL (ref 0.0–1.2)
Total Protein: 7.4 g/dL (ref 6.5–8.1)

## 2023-08-21 LAB — SURGICAL PCR SCREEN
MRSA, PCR: NEGATIVE
Staphylococcus aureus: NEGATIVE

## 2023-08-21 NOTE — Patient Instructions (Addendum)
 Your procedure is scheduled on:09-01-23 Tuesday Report to the Registration Desk on the 1st floor of the [facility].Then proceed to the 2nd floor Surgery Desk To find out your arrival time, please call [phone] between 1PM - 3PM on:08-31-23 Monday If your arrival time is 6:00 am, do not arrive before that time as the [facility] entrance doors do not open until 6:00 am.  REMEMBER: Instructions that are not followed completely may result in serious medical risk, up to and including death; or upon the discretion of your surgeon and anesthesiologist your surgery may need to be rescheduled.  Do not eat food after midnight the night before surgery.  No gum chewing or hard candies.  You may however, drink CLEAR liquids up to 2 hours before you are scheduled to arrive for your surgery. Do not drink anything within 2 hours of your scheduled arrival time.  Clear liquids include: - water  - apple juice without pulp - gatorade (not RED colors) - black coffee or tea (Do NOT add milk or creamers to the coffee or tea) Do NOT drink anything that is not on this list.  In addition, your doctor has ordered for you to drink the provided:  Ensure Pre-Surgery Clear Carbohydrate Drink  Drinking this carbohydrate drink up to two hours before surgery helps to reduce insulin resistance and improve patient outcomes. Please complete drinking 2 hours before scheduled arrival time.  One week prior to surgery:Last dose on 08-24-23 Stop Anti-inflammatories (NSAIDS) such as Advil, Aleve, Ibuprofen, Motrin, Naproxen, Naprosyn and Aspirin based products such as Excedrin, Goody's Powder, BC Powder. Stop ANY OVER THE COUNTER supplements until after surgery (Vitamin D3, Vitamin B12, Vitamin E, Fish Oil)  You may however, continue to take Tylenol if needed for pain up until the day of surgery.  Continue taking all of your other prescription medications up until the day of surgery.  ON THE DAY OF SURGERY ONLY TAKE  THESE MEDICATIONS WITH SIPS OF WATER: -bisoprolol (ZEBETA)  -levothyroxine (SYNTHROID, LEVOTHROID)  -pantoprazole (PROTONIX)  -primidone (MYSOLINE)   Continue your 81 mg Aspirin up until the day prior to surgery-Do NOT take the morning of surgery  No Alcohol for 24 hours before or after surgery.  No Smoking including e-cigarettes for 24 hours before surgery.  No chewable tobacco products for at least 6 hours before surgery.  No nicotine patches on the day of surgery.  Do not use any "recreational" drugs for at least a week (preferably 2 weeks) before your surgery.  Please be advised that the combination of cocaine and anesthesia may have negative outcomes, up to and including death. If you test positive for cocaine, your surgery will be cancelled.  On the morning of surgery brush your teeth with toothpaste and water, you may rinse your mouth with mouthwash if you wish. Do not swallow any toothpaste or mouthwash.  Use CHG Soap as directed on instruction sheet.  Do not wear jewelry, make-up, hairpins, clips or nail polish.  For welded (permanent) jewelry: bracelets, anklets, waist bands, etc.  Please have this removed prior to surgery.  If it is not removed, there is a chance that hospital personnel will need to cut it off on the day of surgery.  Do not wear lotions, powders, or perfumes.   Do not shave body hair from the neck down 48 hours before surgery.  Contact lenses, hearing aids and dentures may not be worn into surgery.  Do not bring valuables to the hospital. [facility] is  not responsible for any missing/lost belongings or valuables.   Total Shoulder Arthroplasty:  use Benzoyl Peroxide 5% Gel as directed on instruction sheet.  Notify your doctor if there is any change in your medical condition (cold, fever, infection).  Wear comfortable clothing (specific to your surgery type) to the hospital.  After surgery, you can help prevent lung complications by doing breathing  exercises.  Take deep breaths and cough every 1-2 hours. Your doctor may order a device called an Incentive Spirometer to help you take deep breaths. When coughing or sneezing, hold a pillow firmly against your incision with both hands. This is called "splinting." Doing this helps protect your incision. It also decreases belly discomfort.  If you are being admitted to the hospital overnight, leave your suitcase in the car. After surgery it may be brought to your room.  In case of increased patient census, it may be necessary for you, the patient, to continue your postoperative care in the Same Day Surgery department.  If you are being discharged the day of surgery, you will not be allowed to drive home. You will need a responsible individual to drive you home and stay with you for 24 hours after surgery.   If you are taking public transportation, you will need to have a responsible individual with you.  Please call the Pre-admissions Testing Dept. at [phone] if you have any questions about these instructions.  Surgery Visitation Policy:  Patients having surgery or a procedure may have two visitors.  Children under the age of 41 must have an adult with them who is not the patient.  Temporary Visitor Restrictions Due to increasing cases of flu, RSV and COVID-19: Children ages 22 and under will not be able to visit patients in [facility] hospitals under most circumstances.    Pre-operative 5 CHG Bath Instructions   You can play a key role in reducing the risk of infection after surgery. Your skin needs to be as free of germs as possible. You can reduce the number of germs on your skin by washing with CHG (chlorhexidine gluconate) soap before surgery. CHG is an antiseptic soap that kills germs and continues to kill germs even after washing.   DO NOT use if you have an allergy to chlorhexidine/CHG or antibacterial soaps. If your skin becomes reddened or irritated, stop using the CHG  and notify one of our RNs at [phone].   Please shower with the CHG soap starting 4 days before surgery using the following schedule:     Please keep in mind the following:  DO NOT shave, including legs and underarms, starting the day of your first shower.   You may shave your face at any point before/day of surgery.  Place clean sheets on your bed the day you start using CHG soap. Use a clean washcloth (not used since being washed) for each shower. DO NOT sleep with pets once you start using the CHG.   CHG Shower Instructions:  If you choose to wash your hair and private area, wash first with your normal shampoo/soap.  After you use shampoo/soap, rinse your hair and body thoroughly to remove shampoo/soap residue.  Turn the water OFF and apply about 3 tablespoons (45 ml) of CHG soap to a CLEAN washcloth.  Apply CHG soap ONLY FROM YOUR NECK DOWN TO YOUR TOES (washing for 3-5 minutes)  DO NOT use CHG soap on face, private areas, open wounds, or sores.  Pay special attention to the area where  your surgery is being performed.  If you are having back surgery, having someone wash your back for you may be helpful. Wait 2 minutes after CHG soap is applied, then you may rinse off the CHG soap.  Pat dry with a clean towel  Put on clean clothes/pajamas   If you choose to wear lotion, please use ONLY the CHG-compatible lotions on the back of this paper.     Additional instructions for the day of surgery: DO NOT APPLY any lotions, deodorants, cologne, or perfumes.   Put on clean/comfortable clothes.  Brush your teeth.  Ask your nurse before applying any prescription medications to the skin.      CHG Compatible Lotions   Aveeno Moisturizing lotion  Cetaphil Moisturizing Cream  Cetaphil Moisturizing Lotion  Clairol Herbal Essence Moisturizing Lotion, Dry Skin  Clairol Herbal Essence Moisturizing Lotion, Extra Dry Skin  Clairol Herbal Essence Moisturizing Lotion, Normal Skin  Curel  Age Defying Therapeutic Moisturizing Lotion with Alpha Hydroxy  Curel Extreme Care Body Lotion  Curel Soothing Hands Moisturizing Hand Lotion  Curel Therapeutic Moisturizing Cream, Fragrance-Free  Curel Therapeutic Moisturizing Lotion, Fragrance-Free  Curel Therapeutic Moisturizing Lotion, Original Formula  Eucerin Daily Replenishing Lotion  Eucerin Dry Skin Therapy Plus Alpha Hydroxy Crme  Eucerin Dry Skin Therapy Plus Alpha Hydroxy Lotion  Eucerin Original Crme  Eucerin Original Lotion  Eucerin Plus Crme Eucerin Plus Lotion  Eucerin TriLipid Replenishing Lotion  Keri Anti-Bacterial Hand Lotion  Keri Deep Conditioning Original Lotion Dry Skin Formula Softly Scented  Keri Deep Conditioning Original Lotion, Fragrance Free Sensitive Skin Formula  Keri Lotion Fast Absorbing Fragrance Free Sensitive Skin Formula  Keri Lotion Fast Absorbing Softly Scented Dry Skin Formula  Keri Original Lotion  Keri Skin Renewal Lotion Keri Silky Smooth Lotion  Keri Silky Smooth Sensitive Skin Lotion  Nivea Body Creamy Conditioning Oil  Nivea Body Extra Enriched Lotion  Nivea Body Original Lotion  Nivea Body Sheer Moisturizing Lotion Nivea Crme  Nivea Skin Firming Lotion  NutraDerm 30 Skin Lotion  NutraDerm Skin Lotion  NutraDerm Therapeutic Skin Cream  NutraDerm Therapeutic Skin Lotion  ProShield Protective Hand Cream  Provon moisturizing lotion  Preparing for Total Shoulder Arthroplasty  Before surgery, you can play an important role by reducing the number of germs on your skin by using the following products:  Benzoyl Peroxide Gel  o Reduces the number of germs present on the skin  o Applied twice a day to shoulder area starting two days before surgery  Chlorhexidine Gluconate (CHG) Soap  o An antiseptic cleaner that kills germs and bonds with the skin to continue killing germs even after washing  o Used for showering the night before surgery and morning of surgery  BENZOYL PEROXIDE  5% GEL  Please do not use if you have an allergy to benzoyl peroxide. If your skin becomes reddened/irritated stop using the benzoyl peroxide.  Starting two days before surgery, apply as follows:  1. Apply benzoyl peroxide in the morning and at night. Apply after taking a shower. If you are not taking a shower, clean entire shoulder front, back, and side along with the armpit with a clean wet washcloth.  2. Place a quarter-sized dollop on your shoulder and rub in thoroughly, making sure to cover the front, back, and side of your shoulder, along with the armpit.  2 days before ____ AM ____ PM 1 day before ____ AM ____ PM  3. Do this twice a day for two days. (Last application is the  night before surgery, AFTER using the CHG soap).  4. Do NOT apply benzoyl peroxide gel on the day of surgery.  How to Use an Incentive Spirometer An incentive spirometer is a tool that measures how well you are filling your lungs with each breath. Learning to take long, deep breaths using this tool can help you keep your lungs clear and active. This may help to reverse or lessen your chance of developing breathing (pulmonary) problems, especially infection. You may be asked to use a spirometer: After a surgery. If you have a lung problem or a history of smoking. After a long period of time when you have been unable to move or be active. If the spirometer includes an indicator to show the highest number that you have reached, your health care provider or respiratory therapist will help you set a goal. Keep a log of your progress as told by your health care provider. What are the risks? Breathing too quickly may cause dizziness or cause you to pass out. Take your time so you do not get dizzy or light-headed. If you are in pain, you may need to take pain medicine before doing incentive spirometry. It is harder to take a deep breath if you are having pain. How to use your incentive spirometer  Sit up on the edge of  your bed or on a chair. Hold the incentive spirometer so that it is in an upright position. Before you use the spirometer, breathe out normally. Place the mouthpiece in your mouth. Make sure your lips are closed tightly around it. Breathe in slowly and as deeply as you can through your mouth, causing the piston or the ball to rise toward the top of the chamber. Hold your breath for 3-5 seconds, or for as long as possible. If the spirometer includes a coach indicator, use this to guide you in breathing. Slow down your breathing if the indicator goes above the marked areas. Remove the mouthpiece from your mouth and breathe out normally. The piston or ball will return to the bottom of the chamber. Rest for a few seconds, then repeat the steps 10 or more times. Take your time and take a few normal breaths between deep breaths so that you do not get dizzy or light-headed. Do this every 1-2 hours when you are awake. If the spirometer includes a goal marker to show the highest number you have reached (best effort), use this as a goal to work toward during each repetition. After each set of 10 deep breaths, cough a few times. This will help to make sure that your lungs are clear. If you have an incision on your chest or abdomen from surgery, place a pillow or a rolled-up towel firmly against the incision when you cough. This can help to reduce pain while taking deep breaths and coughing. General tips When you are able to get out of bed: Walk around often. Continue to take deep breaths and cough in order to clear your lungs. Keep using the incentive spirometer until your health care provider says it is okay to stop using it. If you have been in the hospital, you may be told to keep using the spirometer at home. Contact a health care provider if: You are having difficulty using the spirometer. You have trouble using the spirometer as often as instructed. Your pain medicine is not giving enough relief for  you to use the spirometer as told. You have a fever. Get help right away if: You  develop shortness of breath. You develop a cough with bloody mucus from the lungs. You have fluid or blood coming from an incision site after you cough. Summary An incentive spirometer is a tool that can help you learn to take long, deep breaths to keep your lungs clear and active. You may be asked to use a spirometer after a surgery, if you have a lung problem or a history of smoking, or if you have been inactive for a long period of time. Use your incentive spirometer as instructed every 1-2 hours while you are awake. If you have an incision on your chest or abdomen, place a pillow or a rolled-up towel firmly against your incision when you cough. This will help to reduce pain. Get help right away if you have shortness of breath, you cough up bloody mucus, or blood comes from your incision when you cough. This information is not intended to replace advice given to you by your health care provider. Make sure you discuss any questions you have with your health care provider. Document Revised: 04/17/2023 Document Reviewed: 04/17/2023 Elsevier Patient Education  2024 [employer]  Preoperative Educational Videos for Total Hip, Knee and Shoulder Replacements  To better prepare for surgery, please view our videos that explain the physical activity and discharge planning required to have the best surgical recovery at [facility].  [email or web address]  Questions? Call [phone] or email [email or web address]

## 2023-08-28 ENCOUNTER — Encounter: Payer: Self-pay | Admitting: Orthopedic Surgery

## 2023-08-28 NOTE — Progress Notes (Signed)
 Perioperative / Anesthesia Services  Pre-Admission Testing Clinical Review / Pre-Operative Anesthesia Consult  Date: 08/28/23  Patient Demographics:  Name: Janet Spears DOB: [birth year] MRN:   [id number]  Planned Surgical Procedure(s):    Case: [id number] Date/Time: 09/01/23 0715   Procedure: Left reverse shoulder arthroplasty, biceps tenodesis (Left: Shoulder)   Anesthesia type: Choice   Pre-op diagnosis: Complete tear of left rotator cuff, unspecified whether traumatic M75.122   Location: [facility] OR ROOM 01 / [facility]   Surgeons: Signa Kell, MD      NOTE: Available PAT nursing documentation and vital signs have been reviewed. Clinical nursing staff has updated patient's PMH/PSHx, current medication list, and drug allergies/intolerances to ensure comprehensive history available to assist in medical decision making as it pertains to the aforementioned surgical procedure and anticipated anesthetic course. Extensive review of available clinical information personally performed. [place] PMH and PSHx updated with any diagnoses/procedures that  may have been inadvertently omitted during his intake with the pre-admission testing department's nursing staff.  Clinical Discussion:  Janet Spears is a 88 y.o. female who is submitted for pre-surgical anesthesia review and clearance prior to her undergoing the above procedure. Patient has never been a smoker in the past. Pertinent PMH includes: CAD, CVA, TIA, ? cardiac aneurysm, vasovagal syncope, aortic atherosclerosis, HTN, HLD, hypothyroidism, GERD (on daily PPI), hiatal hernia, BPPV, OA, LEFT rotator cuff tear, sleep difficulties.  Patient is followed by cardiology Darrold Junker, MD). She was last seen in the cardiology clinic on 04/11/2023; notes reviewed. At the time of her clinic visit, patient doing well overall from a cardiovascular perspective.  Patient reported occasional episodes of fluttering in her chest that was  not found to be associated with any particular activity.  Patient denied any chest pain, shortness of breath, PND, orthopnea, significant peripheral edema, weakness, fatigue, vertiginous symptoms, or presyncope/syncope. Patient with a past medical history significant for cardiovascular diagnoses. Documented physical exam was grossly benign, providing no evidence of acute exacerbation and/or decompensation of the patient's known cardiovascular conditions.  Patient underwent diagnostic LEFT heart catheterization on 07/22/2007 revealing single-vessel nonobstructive coronary artery disease.  There was a 20% lesion of the ostial LM noted.  Given the nonobstructive nature of her coronary artery disease, the decision was made to defer further intervention opting for medical management.  Repeat diagnostic LEFT heart catheterization was performed on 07/27/2013, again revealing single-vessel nonobstructive coronary artery disease.  There was a 10% lesion noted in the proximal LAD.  No intervention deemed necessary at that time.  Patient to continue with medical management.  Most recent TTE was performed on 01/02/2021 revealing a normal left ventricular systolic function with an EF of >55%.  There were no regional wall motion abnormalities.  Average left ventricular GLS -20.7%.  Right ventricular size and function normal.  There was trivial to mild mitral, tricuspid, and pulmonary valve regurgitation.  RVSP = 35 mmHg.  Saline microcavitation is normal after Valsalva indicating no evidence of intra-atrial shunting. All transvalvular gradients were noted to be normal providing no evidence suggestive of valvular stenosis. Aorta normal in size with no evidence of ectasia or aneurysmal dilatation.  MRI imaging of the brain performed on 01/02/2021 revealed evidence of a chronic infarct of the RIGHT basal ganglia.  Additionally there were old small lacunar infarcts of the BILATERAL cerebral hemispheres.  Patient also  reporting history of TIAs in the past. Patient has no significant neurological deficits following these neurological events.    Patient with ILR  placement in the past.  Most recent interpretation was on 01/03/2021 revealing events corresponding to sinus rhythm with baseline artifact.  Rhythm was regular and effectively ruled out any episodes of AF/PAF.  Blood pressure reasonably controlled at 132/82 mmHg on currently prescribed beta-blocker (bisoprolol) monotherapy.  Patient is on simvastatin for her HLD diagnosis and ASCVD prevention. Patient is not diabetic. She does not have an OSAH diagnosis.  Patient maintains an active lifestyle and reports that she walks most days for 1 to 2 miles.  She is able to complete all ADLs/IADLs without cardiovascular limitation.  Per the [facility], patient is able to exceed 4 METS of physical activity without experiencing any significant degrees of angina/anginal equivalent symptoms.  No changes were made to her medication regimen during her visit with cardiology.  Patient scheduled to follow-up with outpatient cardiology in 6 months or sooner if needed.  Janet Spears is scheduled for an elective LEFT REVERSE SHOULDER ARTHROPLASTY, BICEPS TENODESIS (LEFT: SHOULDER) on 09/01/2023 with Dr. Signa Kell, MD.  Given patient's past medical history significant for cardiovascular diagnoses, presurgical cardiac clearance was sought by the PAT team. Per cardiology, "this patient is optimized for surgery and may proceed with the planned procedural course with a LOW risk of significant perioperative cardiovascular complications".  In review of the patient's chart, it is noted that she is on daily oral antithrombotic therapy. Given that patient's past medical history is significant for cardiovascular diagnoses, including but not limited to CAD, orthopedics has cleared patient to continue her daily low dose ASA throughout her perioperative course.  Patient has been updated on these  directives from her specialty care providers by the PAT team.  Patient reports previous perioperative complications with anesthesia in the past. Patient has a PMH (+) for PONV. Symptoms and history of PONV will be discussed with patient by anesthesia team on the day of her procedure. Interventions will be ordered as deemed necessary based on patient's individual care needs as determined by anesthesiologist. In review her EMR, it is noted that patient underwent a general anesthetic course here at [facility] ([facility]) in 06/2021 without documented complications.      08/21/2023    1:32 PM 08/21/2023    1:00 PM 03/12/2023    9:59 AM  Vitals with BMI  Height  4\' 11"    Weight  132 lbs   BMI  26.65   Systolic 159  135  Diastolic 69  69  Pulse 79  65   Providers/Specialists:  NOTE: Primary physician provider listed below. Patient may have been seen by APP or partner within same practice.   PROVIDER ROLE / SPECIALTY LAST Sarina Ser, MD Orthopedics (Surgeon) 08/11/2023  Marina Goodell, MD Primary Care Provider 06/04/2023  Marcina Millard, MD Cardiology 04/10/2023   Allergies:   Allergies  Allergen Reactions   Ceftin [Cefuroxime] Hives and Shortness Of Breath   Cefuroxime Axetil Hives and Shortness Of Breath   Lubiprostone Other (See Comments)    BP dropped   Tramadol Hcl     Severe constipation   Lisinopril Other (See Comments)    Chest discomfort   Current Home Medications:   No current facility-administered medications for this encounter.    acetaminophen (TYLENOL) 650 MG CR tablet   aspirin EC 81 MG tablet   bisoprolol (ZEBETA) 5 MG tablet   Cholecalciferol (VITAMIN D-3 PO)   levothyroxine (SYNTHROID, LEVOTHROID) 100 MCG tablet   meclizine (ANTIVERT) 25 MG tablet   Omega-3  Fatty Acids (FISH OIL) 1000 MG CAPS   pantoprazole (PROTONIX) 40 MG tablet   primidone (MYSOLINE) 50 MG tablet   simvastatin (ZOCOR) 80 MG tablet   vitamin  B-12 (CYANOCOBALAMIN) 1000 MCG tablet   vitamin E 1000 UNIT capsule   Polyethyl Glycol-Propyl Glycol (SYSTANE ULTRA OP)   History:   Past Medical History:  Diagnosis Date   Aneurysm of heart wall    Arthritis    BPV (benign positional vertigo)    Coronary artery disease 07/22/2007   a.) LHC 07/22/2007: 20% oLM - med mgmt; b.) LHC 07/27/2013: 10% pLAD - med mgmt   CTS (carpal tunnel syndrome)    GERD (gastroesophageal reflux disease)    Hemorrhoids    Hypercholesteremia    Hypertension    Hypothyroidism    Long-term use of aspirin therapy    PONV (postoperative nausea and vomiting)    Sleeping difficulties    Stroke [facility])    a.) MRI brain 01/02/2021: chronic infarct RIGHT basal ganglia; old small lacunar infarcts of  the BILATERAL cerebral hemispheres   Tear of left supraspinatus tendon    TIA (transient ischemic attack)    x2-1st one in her 50's and 2nd one in 2022   Tremor of both hands    Tubular adenoma    Urinary incontinence    Vasovagal syncope 2016   Vitamin D deficiency    Past Surgical History:  Procedure Laterality Date   ABDOMINAL HYSTERECTOMY     COLONOSCOPY     x2   COLONOSCOPY WITH PROPOFOL N/A 06/04/2015   Procedure: COLONOSCOPY WITH PROPOFOL;  Surgeon: Wallace Cullens, MD;  [facility];  Service: Gastroenterology;  Laterality: N/A;   ESOPHAGOGASTRODUODENOSCOPY (EGD) WITH PROPOFOL N/A 07/15/2021   Procedure: ESOPHAGOGASTRODUODENOSCOPY (EGD) WITH PROPOFOL;  Surgeon: Regis Bill, MD;  Location: [facility] ENDOSCOPY;  Service: Endoscopy;  Laterality: N/A;   EXCISION OF BREAST LESION     FOOT SURGERY Right    LEFT HEART CATH AND CORONARY ANGIOGRAPHY Left 07/22/2007   Procedure: LEFT HEART CATH AND CORONARY ANGIOGRAPHY; Location: [facility]; Surgeon: Marcina Millard, MD   LEFT HEART CATH AND CORONARY ANGIOGRAPHY Left 07/27/2013   Procedure: LEFT HEART CATH AND CORONARY ANGIOGRAPHY; Location: [facility]; Surgeon: Marcina Millard, MD   ROTATOR CUFF REPAIR  Right 2015   TONSILLECTOMY     No family history on file. Social History   Tobacco Use   Smoking status: Never   Smokeless tobacco: Never  Substance Use Topics   Alcohol use: No   Pertinent Clinical Results:  LABS:  Hospital Outpatient Visit on 08/21/2023  Component Date Value Ref Range Status   MRSA, PCR 08/21/2023 NEGATIVE  NEGATIVE Final   Staphylococcus aureus 08/21/2023 NEGATIVE  NEGATIVE Final   Comment: (NOTE) The Xpert SA Assay (FDA approved for NASAL specimens in patients 63 years of age and older), is one component of a comprehensive surveillance program. It is not intended to diagnose infection nor to guide or monitor treatment. Performed at [facility], [address] [place], [place] [address]    WBC 08/21/2023 7.2  4.0 - 10.5 K/uL Final   RBC 08/21/2023 3.61 (L)  3.87 - 5.11 MIL/uL Final   Hemoglobin 08/21/2023 12.4  12.0 - 15.0 g/dL Final   HCT 40/98/1191 36.1  36.0 - 46.0 % Final   MCV 08/21/2023 100.0  80.0 - 100.0 fL Final   MCH 08/21/2023 34.3 (H)  26.0 - 34.0 pg Final   MCHC 08/21/2023 34.3  30.0 - 36.0 g/dL  Final   RDW 08/21/2023 12.9  11.5 - 15.5 % Final   Platelets 08/21/2023 234  150 - 400 K/uL Final   nRBC 08/21/2023 0.0  0.0 - 0.2 % Final   Neutrophils Relative % 08/21/2023 59  % Final   Neutro Abs 08/21/2023 4.2  1.7 - 7.7 K/uL Final   Lymphocytes Relative 08/21/2023 27  % Final   Lymphs Abs 08/21/2023 2.0  0.7 - 4.0 K/uL Final   Monocytes Relative 08/21/2023 11  % Final   Monocytes Absolute 08/21/2023 0.8  0.1 - 1.0 K/uL Final   Eosinophils Relative 08/21/2023 2  % Final   Eosinophils Absolute 08/21/2023 0.2  0.0 - 0.5 K/uL Final   Basophils Relative 08/21/2023 1  % Final   Basophils Absolute 08/21/2023 0.0  0.0 - 0.1 K/uL Final   Immature Granulocytes 08/21/2023 0  % Final   Abs Immature Granulocytes 08/21/2023 0.02  0.00 - 0.07 K/uL Final   Performed at [facility], [address] [place], [place] [address]    Sodium 08/21/2023 136  135 - 145 mmol/L Final   Potassium 08/21/2023 3.9  3.5 - 5.1 mmol/L Final   Chloride 08/21/2023 102  98 - 111 mmol/L Final   CO2 08/21/2023 24  22 - 32 mmol/L Final   Glucose, Bld 08/21/2023 89  70 - 99 mg/dL Final   Glucose reference range applies only to samples taken after fasting for at least 8 hours.   BUN 08/21/2023 15  8 - 23 mg/dL Final   Creatinine, Ser 08/21/2023 0.54  0.44 - 1.00 mg/dL Final   Calcium 86/57/8469 9.4  8.9 - 10.3 mg/dL Final   Total Protein 62/95/2841 7.4  6.5 - 8.1 g/dL Final   Albumin 32/44/0102 4.1  3.5 - 5.0 g/dL Final   AST 72/53/6644 17  15 - 41 U/L Final   ALT 08/21/2023 14  0 - 44 U/L Final   Alkaline Phosphatase 08/21/2023 55  38 - 126 U/L Final   Total Bilirubin 08/21/2023 0.3  0.0 - 1.2 mg/dL Final   GFR, Estimated 08/21/2023 >60  >60 mL/min Final   Comment: (NOTE) Calculated using the CKD-EPI Creatinine Equation (2021)    Anion gap 08/21/2023 10  5 - 15 Final   Performed at [facility], [address] [place], [place] [address]   Color, Urine 08/21/2023 STRAW (A)  YELLOW Final   APPearance 08/21/2023 CLEAR (A)  CLEAR Final   Specific Gravity, Urine 08/21/2023 1.005  1.005 - 1.030 Final   pH 08/21/2023 6.0  5.0 - 8.0 Final   Glucose, UA 08/21/2023 NEGATIVE  NEGATIVE mg/dL Final   Hgb urine dipstick 08/21/2023 NEGATIVE  NEGATIVE Final   Bilirubin Urine 08/21/2023 NEGATIVE  NEGATIVE Final   Ketones, ur 08/21/2023 NEGATIVE  NEGATIVE mg/dL Final   Protein, ur 25/95/6387 NEGATIVE  NEGATIVE mg/dL Final   Nitrite 56/43/3295 NEGATIVE  NEGATIVE Final   Leukocytes,Ua 08/21/2023 NEGATIVE  NEGATIVE Final   Performed at [facility], [address] [place], [place] [address]    ECG: Date: 08/21/2023  Time ECG obtained: 1409 PM Rate: 77 bpm Rhythm: normal sinus Axis (leads I and aVF): normal Intervals: PR 182 ms. QRS 94 ms. QTc 434 ms. ST segment and T wave changes: No evidence of acute T wave abnormalities  or significant ST segment elevation or depression.  Evidence of a possible, age undetermined, prior infarct:  No Comparison: Similar to previous tracing obtained on 12/27/2020   IMAGING / PROCEDURES: CT  SHOULDER LEFT WO CONTRAST performed on 08/11/2023 Mild osteoarthritis of the glenohumeral joint. Mild arthropathy of the acromioclavicular joint. Aortic atherosclerosis  MR SHOULDER LEFT WO CONTRAST performed on 07/20/2023 Full-thickness full width rupture of the supraspinatus tendon retracted 3.1 cm. Mild supraspinatus and infraspinatus muscular atrophy. Moderate distal infraspinatus tendinopathy. Mild to moderate degenerative spurring in the acromioclavicular joint. Moderate degenerative chondral thinning in the glenohumeral joint.  MRI BRAIN WITHOUT CONTRAST performed on 01/02/2021 No evidence of acute infarct Chronic infarct right basal ganglia and old small lacunar infarcts of the bilateral cerebral hemispheres.   ECHO COMPLETE WITH BUBBLE STUDY performed on 01/02/2021 Normal left ventricular systolic function with an EF of >55% Diastolic Doppler parameters indeterminant LV GLS -20.7% Normal right ventricular systolic function Trivial TR and OR Mild MR RVSP = 35 mmHg Normal transvalvular gradients; no valvular stenosis No pericardial effusion Negative saline contrast study   CT ABDOMEN PELVIS WO CONTRAST performed on 12/27/2020 Sigmoid diverticulitis without abscess. Moderate hiatal hernia Aortic Atherosclerosis   Impression and Plan:  Janet Spears has been referred for pre-anesthesia review and clearance prior to her undergoing the planned anesthetic and procedural courses. Available labs, pertinent testing, and imaging results were personally reviewed by me in preparation for upcoming operative/procedural course. [facility] medical record has been updated following extensive record review and patient interview with PAT staff.   This patient has been appropriately  cleared by cardiology with an overall LOW risk of experiencing significant perioperative cardiovascular complications. Based on clinical review performed today (08/28/23), barring any significant acute changes in the patient's overall condition, it is anticipated that she will be able to proceed with the planned surgical intervention. Any acute changes in clinical condition may necessitate her procedure being postponed and/or cancelled. Patient will meet with anesthesia team (MD and/or CRNA) on the day of her procedure for preoperative evaluation/assessment. Questions regarding anesthetic course will be fielded at that time.   Pre-surgical instructions were reviewed with the patient during his PAT appointment, and questions were fielded to satisfaction by PAT clinical staff. She has been instructed on which medications that she will need to hold prior to surgery, as well as the ones that have been deemed safe/appropriate to take on the day of his procedure. As part of the general education provided by PAT, patient made aware both verbally and in writing, that she would need to abstain from the use of any illegal substances during his perioperative course. She was advised that failure to follow the provided instructions could necessitate case cancellation or result in serious perioperative complications up to and including death. Patient encouraged to contact PAT and/or her surgeon's office to discuss any questions or concerns that may arise prior to surgery; verbalized understanding.   Quentin Mulling, MSN, APRN, FNP-C, CEN [facility]  Perioperative Services Nurse Practitioner Phone: [phone] Fax: [phone] 08/28/23 4:00 PM  NOTE: This note has been prepared using Dragon dictation software. Despite my best ability to proofread, there is always the potential that unintentional transcriptional errors may still occur from this process.

## 2023-08-31 MED ORDER — CHLORHEXIDINE GLUCONATE 0.12 % MT SOLN
15.0000 mL | Freq: Once | OROMUCOSAL | Status: AC
  Administered 2023-09-01: 15 mL via OROMUCOSAL

## 2023-08-31 MED ORDER — VANCOMYCIN HCL IN DEXTROSE 1-5 GM/200ML-% IV SOLN
1000.0000 mg | INTRAVENOUS | Status: AC
  Administered 2023-09-01 (×2): 1000 mg via INTRAVENOUS

## 2023-08-31 MED ORDER — ORAL CARE MOUTH RINSE: 15.0000 mL | Freq: Once | OROMUCOSAL | Status: AC

## 2023-08-31 MED ORDER — TRANEXAMIC ACID-NACL 1000-0.7 MG/100ML-% IV SOLN
1000.0000 mg | INTRAVENOUS | Status: AC
  Administered 2023-09-01: 1000 mg via INTRAVENOUS

## 2023-09-01 ENCOUNTER — Ambulatory Visit: Payer: Self-pay | Admitting: Urgent Care

## 2023-09-01 ENCOUNTER — Other Ambulatory Visit: Payer: Self-pay

## 2023-09-01 ENCOUNTER — Ambulatory Visit

## 2023-09-01 ENCOUNTER — Encounter: Payer: Self-pay | Admitting: Orthopedic Surgery

## 2023-09-01 ENCOUNTER — Encounter
Admission: RE | Disposition: A | Discharge status: Home / Self Care | Payer: Self-pay | Source: Home / Self Care | Attending: Orthopedic Surgery

## 2023-09-01 ENCOUNTER — Ambulatory Visit
Admission: RE | Admit: 2023-09-01 | Discharge: 2023-09-01 | Disposition: A | Discharge status: Home / Self Care | Payer: Medicare Other | Attending: Orthopedic Surgery | Admitting: Orthopedic Surgery

## 2023-09-01 DIAGNOSIS — E039 Hypothyroidism, unspecified: Secondary | ICD-10-CM | POA: Diagnosis not present

## 2023-09-01 DIAGNOSIS — K449 Diaphragmatic hernia without obstruction or gangrene: Secondary | ICD-10-CM | POA: Diagnosis not present

## 2023-09-01 DIAGNOSIS — K219 Gastro-esophageal reflux disease without esophagitis: Secondary | ICD-10-CM | POA: Diagnosis not present

## 2023-09-01 DIAGNOSIS — I69934 Monoplegia of upper limb following unspecified cerebrovascular disease affecting left non-dominant side: Secondary | ICD-10-CM | POA: Insufficient documentation

## 2023-09-01 DIAGNOSIS — M75102 Unspecified rotator cuff tear or rupture of left shoulder, not specified as traumatic: Secondary | ICD-10-CM | POA: Diagnosis present

## 2023-09-01 DIAGNOSIS — I10 Essential (primary) hypertension: Secondary | ICD-10-CM | POA: Diagnosis not present

## 2023-09-01 DIAGNOSIS — I2510 Atherosclerotic heart disease of native coronary artery without angina pectoris: Secondary | ICD-10-CM | POA: Diagnosis not present

## 2023-09-01 HISTORY — DX: Atherosclerosis of aorta: I70.0

## 2023-09-01 HISTORY — DX: Benign neoplasm, unspecified site: D36.9

## 2023-09-01 HISTORY — DX: Long term (current) use of aspirin: Z79.82

## 2023-09-01 HISTORY — PX: REVERSE SHOULDER ARTHROPLASTY: SHX5054

## 2023-09-01 HISTORY — DX: Unspecified urinary incontinence: R32

## 2023-09-01 HISTORY — DX: Carpal tunnel syndrome, unspecified upper limb: G56.00

## 2023-09-01 HISTORY — DX: Diverticulosis of intestine, part unspecified, without perforation or abscess without bleeding: K57.90

## 2023-09-01 HISTORY — DX: Diaphragmatic hernia without obstruction or gangrene: K44.9

## 2023-09-01 SURGERY — ARTHROPLASTY, SHOULDER, TOTAL, REVERSE
Anesthesia: General | Site: Shoulder | Laterality: Left

## 2023-09-01 MED ORDER — LIDOCAINE HCL (PF) 1 % IJ SOLN
INTRAMUSCULAR | Status: DC | PRN
  Administered 2023-09-01: 2.5 mL via SUBCUTANEOUS

## 2023-09-01 MED ORDER — PROPOFOL 10 MG/ML IV BOLUS
INTRAVENOUS | Status: AC
  Filled 2023-09-01: qty 40

## 2023-09-01 MED ORDER — PROPOFOL 500 MG/50ML IV EMUL
INTRAVENOUS | Status: DC | PRN
  Administered 2023-09-01: 15 ug/kg/min via INTRAVENOUS

## 2023-09-01 MED ORDER — PROPOFOL 10 MG/ML IV BOLUS
INTRAVENOUS | Status: DC | PRN
  Administered 2023-09-01: 80 mg via INTRAVENOUS

## 2023-09-01 MED ORDER — DEXAMETHASONE SODIUM PHOSPHATE 10 MG/ML IJ SOLN
INTRAMUSCULAR | Status: AC
  Filled 2023-09-01: qty 1

## 2023-09-01 MED ORDER — FENTANYL CITRATE PF 50 MCG/ML IJ SOSY
PREFILLED_SYRINGE | INTRAMUSCULAR | Status: AC
Start: 2023-09-01 — End: ?
  Filled 2023-09-01: qty 1

## 2023-09-01 MED ORDER — OXYCODONE HCL 5 MG PO TABS: 5.0000 mg | ORAL_TABLET | ORAL | 0 refills | Status: AC | PRN

## 2023-09-01 MED ORDER — SEVOFLURANE IN SOLN
RESPIRATORY_TRACT | Status: AC
  Filled 2023-09-01: qty 250

## 2023-09-01 MED ORDER — FENTANYL CITRATE (PF) 100 MCG/2ML IJ SOLN
INTRAMUSCULAR | Status: AC
  Filled 2023-09-01: qty 2

## 2023-09-01 MED ORDER — CHLORHEXIDINE GLUCONATE 0.12 % MT SOLN
OROMUCOSAL | Status: AC
  Filled 2023-09-01: qty 15

## 2023-09-01 MED ORDER — TRANEXAMIC ACID-NACL 1000-0.7 MG/100ML-% IV SOLN
INTRAVENOUS | Status: AC
  Filled 2023-09-01: qty 100

## 2023-09-01 MED ORDER — FENTANYL CITRATE PF 50 MCG/ML IJ SOSY
50.0000 ug | PREFILLED_SYRINGE | Freq: Once | INTRAMUSCULAR | Status: AC
  Administered 2023-09-01: 50 ug via INTRAVENOUS

## 2023-09-01 MED ORDER — PHENYLEPHRINE HCL-NACL 20-0.9 MG/250ML-% IV SOLN
INTRAVENOUS | Status: DC | PRN
  Administered 2023-09-01: 25 ug/min via INTRAVENOUS

## 2023-09-01 MED ORDER — DEXAMETHASONE SODIUM PHOSPHATE 10 MG/ML IJ SOLN
INTRAMUSCULAR | Status: DC | PRN
  Administered 2023-09-01: 8 mg via INTRAVENOUS

## 2023-09-01 MED ORDER — PROPOFOL 1000 MG/100ML IV EMUL
INTRAVENOUS | Status: AC
  Filled 2023-09-01: qty 100

## 2023-09-01 MED ORDER — ACETAMINOPHEN 10 MG/ML IV SOLN
INTRAVENOUS | Status: DC | PRN
  Administered 2023-09-01: 1000 mg via INTRAVENOUS

## 2023-09-01 MED ORDER — GLYCOPYRROLATE 0.2 MG/ML IJ SOLN
INTRAMUSCULAR | Status: DC | PRN
  Administered 2023-09-01: .2 mg via INTRAVENOUS

## 2023-09-01 MED ORDER — SUGAMMADEX SODIUM 200 MG/2ML IV SOLN
INTRAVENOUS | Status: AC
  Filled 2023-09-01: qty 2

## 2023-09-01 MED ORDER — FENTANYL CITRATE (PF) 100 MCG/2ML IJ SOLN: 25.0000 ug | INTRAMUSCULAR | Status: DC | PRN

## 2023-09-01 MED ORDER — LIDOCAINE HCL (PF) 2 % IJ SOLN
INTRAMUSCULAR | Status: AC
  Filled 2023-09-01: qty 5

## 2023-09-01 MED ORDER — GLYCOPYRROLATE 0.2 MG/ML IJ SOLN
INTRAMUSCULAR | Status: AC
  Filled 2023-09-01: qty 1

## 2023-09-01 MED ORDER — SUGAMMADEX SODIUM 200 MG/2ML IV SOLN
INTRAVENOUS | Status: DC | PRN
  Administered 2023-09-01: 119.8 mg via INTRAVENOUS

## 2023-09-01 MED ORDER — VANCOMYCIN HCL IN DEXTROSE 1-5 GM/200ML-% IV SOLN
INTRAVENOUS | Status: AC
  Filled 2023-09-01: qty 200

## 2023-09-01 MED ORDER — ACETAMINOPHEN 500 MG PO TABS: 1000.0000 mg | ORAL_TABLET | Freq: Three times a day (TID) | ORAL | 2 refills | Status: AC

## 2023-09-01 MED ORDER — ONDANSETRON 4 MG PO TBDP: 4.0000 mg | ORAL_TABLET | Freq: Three times a day (TID) | ORAL | 0 refills | Status: AC | PRN

## 2023-09-01 MED ORDER — VANCOMYCIN HCL 1000 MG IV SOLR
INTRAVENOUS | Status: AC
  Filled 2023-09-01: qty 20

## 2023-09-01 MED ORDER — ASPIRIN 325 MG PO TBEC
325.0000 mg | DELAYED_RELEASE_TABLET | Freq: Every day | ORAL | 0 refills | Status: AC
Start: 2023-09-02 — End: 2023-10-14

## 2023-09-01 MED ORDER — TRANEXAMIC ACID-NACL 1000-0.7 MG/100ML-% IV SOLN
1000.0000 mg | INTRAVENOUS | Status: AC
  Administered 2023-09-01: 1000 mg via INTRAVENOUS

## 2023-09-01 MED ORDER — BUPIVACAINE HCL (PF) 0.5 % IJ SOLN
INTRAMUSCULAR | Status: AC
  Filled 2023-09-01: qty 10

## 2023-09-01 MED ORDER — EPHEDRINE 5 MG/ML INJ
INTRAVENOUS | Status: AC
  Filled 2023-09-01: qty 5

## 2023-09-01 MED ORDER — LIDOCAINE HCL (PF) 1 % IJ SOLN
INTRAMUSCULAR | Status: AC
  Filled 2023-09-01: qty 5

## 2023-09-01 MED ORDER — GLYCOPYRROLATE 0.2 MG/ML IJ SOLN
INTRAMUSCULAR | Status: AC
Start: 2023-09-01 — End: ?
  Filled 2023-09-01: qty 1

## 2023-09-01 MED ORDER — BUPIVACAINE HCL (PF) 0.5 % IJ SOLN
INTRAMUSCULAR | Status: DC | PRN
Start: 2023-09-01 — End: 2023-09-01
  Administered 2023-09-01: 10 mL via PERINEURAL

## 2023-09-01 MED ORDER — PHENYLEPHRINE HCL-NACL 20-0.9 MG/250ML-% IV SOLN
INTRAVENOUS | Status: AC
  Filled 2023-09-01: qty 250

## 2023-09-01 MED ORDER — EPHEDRINE SULFATE-NACL 50-0.9 MG/10ML-% IV SOSY
PREFILLED_SYRINGE | INTRAVENOUS | Status: DC | PRN
  Administered 2023-09-01: 5 mg via INTRAVENOUS
  Administered 2023-09-01: 2.5 mg via INTRAVENOUS

## 2023-09-01 MED ORDER — ACETAMINOPHEN 10 MG/ML IV SOLN
INTRAVENOUS | Status: AC
  Filled 2023-09-01: qty 100

## 2023-09-01 MED ORDER — ROCURONIUM BROMIDE 10 MG/ML (PF) SYRINGE
PREFILLED_SYRINGE | INTRAVENOUS | Status: AC
  Filled 2023-09-01: qty 10

## 2023-09-01 MED ORDER — SODIUM CHLORIDE 0.9 % IR SOLN
Status: DC | PRN
  Administered 2023-09-01: 3000 mL

## 2023-09-01 MED ORDER — BUPIVACAINE LIPOSOME 1.3 % IJ SUSP
INTRAMUSCULAR | Status: AC
  Filled 2023-09-01: qty 10

## 2023-09-01 MED ORDER — LACTATED RINGERS IV SOLN: INTRAVENOUS | Status: DC

## 2023-09-01 MED ORDER — ONDANSETRON HCL 4 MG/2ML IJ SOLN
INTRAMUSCULAR | Status: DC | PRN
  Administered 2023-09-01: 4 mg via INTRAVENOUS

## 2023-09-01 MED ORDER — ONDANSETRON HCL 4 MG/2ML IJ SOLN
INTRAMUSCULAR | Status: AC
  Filled 2023-09-01: qty 2

## 2023-09-01 MED ORDER — ROCURONIUM BROMIDE 100 MG/10ML IV SOLN
INTRAVENOUS | Status: DC | PRN
  Administered 2023-09-01: 40 mg via INTRAVENOUS
  Administered 2023-09-01: 30 mg via INTRAVENOUS

## 2023-09-01 MED ORDER — BUPIVACAINE LIPOSOME 1.3 % IJ SUSP
INTRAMUSCULAR | Status: DC | PRN
  Administered 2023-09-01: 10 mL via PERINEURAL

## 2023-09-01 MED ORDER — FENTANYL CITRATE (PF) 100 MCG/2ML IJ SOLN
INTRAMUSCULAR | Status: DC | PRN
  Administered 2023-09-01: 50 ug via INTRAVENOUS

## 2023-09-01 MED ORDER — DROPERIDOL 2.5 MG/ML IJ SOLN: 0.6250 mg | Freq: Once | INTRAMUSCULAR | Status: DC | PRN

## 2023-09-01 MED ORDER — 0.9 % SODIUM CHLORIDE (POUR BTL) OPTIME
TOPICAL | Status: DC | PRN
  Administered 2023-09-01: 500 mL

## 2023-09-01 MED ORDER — VANCOMYCIN HCL 1000 MG IV SOLR
INTRAVENOUS | Status: DC | PRN
  Administered 2023-09-01: 1000 mg via TOPICAL

## 2023-09-01 SURGICAL SUPPLY — 77 items
BASEPLATE P2 COATD GLND 6.5X30 (Shoulder) IMPLANT
BLADE SAGITTAL WIDE XTHICK NO (BLADE) ×1 IMPLANT
CHLORAPREP W/TINT 26 (MISCELLANEOUS) ×1 IMPLANT
CNTNR URN SCR LID CUP LEK RST (MISCELLANEOUS) IMPLANT
COOLER POLAR GLACIER W/PUMP (MISCELLANEOUS) ×1 IMPLANT
DERMABOND ADVANCED .7 DNX12 (GAUZE/BANDAGES/DRESSINGS) IMPLANT
DRAPE INCISE IOBAN 66X45 STRL (DRAPES) ×1 IMPLANT
DRAPE SHEET LG 3/4 BI-LAMINATE (DRAPES) ×2 IMPLANT
DRAPE TABLE BACK 80X90 (DRAPES) ×1 IMPLANT
DRILL GLEN ALTIVATE 3.5 (DRILL) IMPLANT
DRSG OPSITE POSTOP 3X4 (GAUZE/BANDAGES/DRESSINGS) IMPLANT
DRSG OPSITE POSTOP 4X6 (GAUZE/BANDAGES/DRESSINGS) IMPLANT
DRSG OPSITE POSTOP 4X8 (GAUZE/BANDAGES/DRESSINGS) IMPLANT
DRSG TEGADERM 2-3/8X2-3/4 SM (GAUZE/BANDAGES/DRESSINGS) IMPLANT
ELECT REM PT RETURN 9FT ADLT (ELECTROSURGICAL) ×1 IMPLANT
ELECTRODE REM PT RTRN 9FT ADLT (ELECTROSURGICAL) ×1 IMPLANT
EVACUATOR 1/8 PVC DRAIN (DRAIN) IMPLANT
GAUZE SPONGE 2X2 STRL 8-PLY (GAUZE/BANDAGES/DRESSINGS) IMPLANT
GAUZE XEROFORM 1X8 LF (GAUZE/BANDAGES/DRESSINGS) IMPLANT
GLOVE BIOGEL PI IND STRL 8 (GLOVE) ×2 IMPLANT
GLOVE PI ULTRA LF STRL 7.5 (GLOVE) ×2 IMPLANT
GLOVE SURG ORTHO 8.0 STRL STRW (GLOVE) ×2 IMPLANT
GLOVE SURG SYN 8.0 (GLOVE) ×1 IMPLANT
GLOVE SURG SYN 8.0 PF PI (GLOVE) ×1 IMPLANT
GOWN STRL REUS W/ TWL LRG LVL3 (GOWN DISPOSABLE) ×2 IMPLANT
GOWN STRL REUS W/ TWL XL LVL3 (GOWN DISPOSABLE) ×1 IMPLANT
GUIDE BONE MODEL RSA DJO (ORTHOPEDIC DISPOSABLE SUPPLIES) IMPLANT
GUIDE WIRE ALTIVATE 2.4X228 SL (WIRE) IMPLANT
HOOD PEEL AWAY T7 (MISCELLANEOUS) ×2 IMPLANT
HUMERA STEM SM SHELL SHOU 10 (Miscellaneous) ×1 IMPLANT
INSERT SMALL SOCKET 32MM NEU (Insert) IMPLANT
IV NS IRRIG 3000ML ARTHROMATIC (IV SOLUTION) ×1 IMPLANT
KIT STABILIZATION SHOULDER (MISCELLANEOUS) ×1 IMPLANT
MANIFOLD NEPTUNE II (INSTRUMENTS) ×1 IMPLANT
MASK FACE SPIDER DISP (MASK) ×1 IMPLANT
MAT ABSORB FLUID 56X50 GRAY (MISCELLANEOUS) ×1 IMPLANT
NDL REVERSE CUT 1/2 CRC (NEEDLE) IMPLANT
NDL SPNL 20GX3.5 QUINCKE YW (NEEDLE) IMPLANT
NEEDLE REVERSE CUT 1/2 CRC (NEEDLE) IMPLANT
NEEDLE SPNL 20GX3.5 QUINCKE YW (NEEDLE) IMPLANT
NS IRRIG 500ML POUR BTL (IV SOLUTION) ×1 IMPLANT
P2 COATDE GLNOID BSEPLT 6.5X30 (Shoulder) ×1 IMPLANT
PACK ARTHROSCOPY SHOULDER (MISCELLANEOUS) ×1 IMPLANT
PAD WRAPON POLAR SHDR XLG (MISCELLANEOUS) ×1 IMPLANT
PULSAVAC PLUS IRRIG FAN TIP (DISPOSABLE) ×1 IMPLANT
SCREW BONE LOCKING RSP 5.0X14 (Screw) ×1 IMPLANT
SCREW BONE LOCKING RSP 5.0X30 (Screw) ×1 IMPLANT
SCREW BONE RSP LOCK 5X14 (Screw) IMPLANT
SCREW BONE RSP LOCK 5X26 (Screw) IMPLANT
SCREW BONE RSP LOCK 5X30 (Screw) IMPLANT
SCREW BONE RSP LOCKING 5.0X26 (Screw) ×1 IMPLANT
SCREW RETAIN W/HEAD 4MM OFFSET (Shoulder) IMPLANT
SLING ULTRA II LG (MISCELLANEOUS) IMPLANT
SLING ULTRA II M (MISCELLANEOUS) IMPLANT
SPONGE T-LAP 18X18 ~~LOC~~+RFID (SPONGE) ×1 IMPLANT
STAPLER SKIN PROX 35W (STAPLE) IMPLANT
STEM HUMERAL SM SHELL SHOU 10 (Miscellaneous) IMPLANT
STRAP SAFETY 5IN WIDE (MISCELLANEOUS) ×1 IMPLANT
SUT ETHIBOND 5-0 MS/4 CCS GRN (SUTURE) IMPLANT
SUT FIBERWIRE #2 38 BLUE 1/2 (SUTURE) ×4 IMPLANT
SUT MNCRL AB 4-0 PS2 18 (SUTURE) IMPLANT
SUT PROLENE 6 0 P 1 18 (SUTURE) IMPLANT
SUT TICRON 2-0 30IN 311381 (SUTURE) ×2 IMPLANT
SUT VIC AB 0 CT1 36 (SUTURE) ×1 IMPLANT
SUT VIC AB 2-0 CT2 27 (SUTURE) ×2 IMPLANT
SUT XBRAID 1.4 BLK/WHT (SUTURE) IMPLANT
SUT XBRAID 1.4 BLUE (SUTURE) IMPLANT
SUT XBRAID 1.4 WHITE/BLUE (SUTURE) IMPLANT
SUT XBRAID 2 BLACK/BLUE (SUTURE) IMPLANT
SUTURE ETHBND 5-0 MS/4 CCS GRN (SUTURE) ×1 IMPLANT
SUTURE FIBERWR #2 38 BLUE 1/2 (SUTURE) ×1 IMPLANT
SYR 30ML LL (SYRINGE) IMPLANT
TAP CANN GLEN 6.5 (TAP) IMPLANT
TIP FAN IRRIG PULSAVAC PLUS (DISPOSABLE) ×1 IMPLANT
TRAP FLUID SMOKE EVACUATOR (MISCELLANEOUS) ×1 IMPLANT
WATER STERILE IRR 1000ML POUR (IV SOLUTION) ×1 IMPLANT
WRAPON POLAR PAD SHDR XLG (MISCELLANEOUS) ×1 IMPLANT

## 2023-09-01 NOTE — Op Note (Signed)
 SURGERY DATE: 09/01/2023   PRE-OP DIAGNOSIS:  1. Left shoulder rotator cuff arthropathy   POST-OP DIAGNOSIS:  1. Left shoulder rotator cuff arthropathy   PROCEDURES:  1. Left reverse total shoulder arthroplasty 2. Left biceps tenodesis   SURGEON: Rosealee Albee, MD  ASSISTANTS: Dedra Skeens, PA   ANESTHESIA: Gen + interscalene block   ESTIMATED BLOOD LOSS: 200cc   TOTAL IV FLUIDS: per anesthesia record  IMPLANTS: [employer]: RSP Glenoid Head w/Retaining screw 32-4; Monoblock Reverse Shoulder Baseplate with 6.61mm central screw; 3 locking screws into baseplate (superior, posterior, inferior); Small Shell Short Humeral Stem 10 x 48mm; Neutral Small Socket Insert;    INDICATION(S):  Janet Spears is a 88 y.o. female with chronic shoulder pain with inability to lift arm overhead. Imaging consistent with massive, irreparable rotator cuff tear. Conservative measures including medications and cortisone injections have not provided adequate relief. After discussion of risks, benefits, and alternatives to surgery, the patient elected to proceed with reverse shoulder arthroplasty and biceps tenodesis.   OPERATIVE FINDINGS: massive rotator cuff tear (complete supraspinatus, partial infraspinatus, partial subscapularis)   OPERATIVE REPORT:   I identified Janet Spears in the pre-operative holding area. Informed consent was obtained and the surgical site was marked. I reviewed the risks and benefits of the proposed surgical intervention and the patient wished to proceed. An interscalene block with Exparel was administered by the Anesthesia team. The patient was transferred to the operative suite and general anesthesia was administered. The patient was placed in the beach chair position with the head of the bed elevated approximately 45 degrees. All down side pressure points were appropriately padded. Pre-op exam under anesthesia confirmed some stiffness and crepitus. Appropriate IV antibiotics  were administered. The extremity was then prepped and draped in standard fashion. A time out was performed confirming the correct extremity, correct patient, and correct procedure.   We used the standard deltopectoral incision from the coracoid to ~12cm distal. We found the cephalic vein and took it medially. We opened the deltopectoral interval widely and placed retractors under the CA ligament in the subacromial space and under the deltoid tendon at its insertion. We then abducted and internally rotated the arm and released the underlying bursa between these retractors, taking care not to damage the circumflex branch of the axillary nerve.   Next, we brought the arm back in adduction at slight forward flexion with external rotation. We opened the clavipectoral fascia lateral to the conjoint tendon. We gently palpated the axillary nerve and verified its position and continuity on both sides of the humerus with a Tug test. This test was repeated multiple times during the procedure for nerve localization and confirmed to be intact at the end of the case. We then cauterized the anterior humeral circumflex ("Three sisters") vessels. The arm was then internally rotated, we cut the falciform ligament at approximately 1 cm of the upper portion of the pectoralis major insertion. Next we unroofed the bicipital groove. We proceeded with a soft tissue biceps tenodesis given the pathology (significant thickening and tendinopathy proximally) of the tendon.  After opening the biceps tendon sheath all the way to the supraglenoid tubercle, we performed a biceps tenodesis with two #2 TiCron sutures to the upper border of the pectoralis major. The proximal portion of the tendon was excised.   At this point, we could see that the supraspinatus and anterior infraspinatus were completely torn with a bald humeral head superiorly. The posterior infraspinatus was still attached. The subscapularis also  had a partial tear of the  superior fibers. We performed a subscapularis peel using electrocautery to remove the anterior capsule and subscapularis off of the humeral head. We released the inferior capsule from the humerus all the way to the posterior band of the inferior glenohumeral ligament. When this was complete we gently dislocated the shoulder up into the wound. We removed any osteophytes and made our cut with the appropriate inclination in 20 degrees of retroversion  We then turned our attention back to the glenoid. The proximal humerus was retracted posteriorly. The anterior capsule was dissected free from the subscapularis. The anterior capsule was then excised, exposing the anterior glenoid. We then grasped the labrum and removed it circumferentially. During the glenoid exposure, the axillary nerve was protected the entire time.    A patient-specific guide was used to drill the central guidepin. An appropriately sized reamer was used to ream the glenoid. A cannulated tap was placed over the guidepin. The monoblock baseplate was inserted and excellent fixation was achieved such that the entire scapula rotated with further attempted seating of the baseplate. The 3 peripheral screws were drilled, measured, and placed. The wound was thoroughly irrigated. The glenosphere was then placed and tightened.   We then turned our attention back to the humerus. We sized for a small shell prosthesis. We sequentially used larger diameter canal finders until we met appropriate resistance and sequential broaching was performed to this size listed above. Trial poly inserts were placed. The humerus was trialed and noted to have satisfactory stability, motion, and deltoid tension with above listed poly. The trial implants were removed. 3 drill holes were placed about the lesser tuberosity footprint and FiberWire sutures were passed through these holes for subscapularis repair. Next, the implant was placed with the appropriate retroversion.  Stability was confirmed. We placed the actual poly insert. The humerus was reduced and motion, tension, and stability were satisfactory. A Hemovac drain was placed. The wound was thoroughly irrigated. Subscapularis was repaired with the previously passed #2 FiberWire sutures after passing them through the subscapularis.   We again verified the tension on the axillary nerve, appropriate range of motion, stability of the implant, and security of the subscapularis repair. We closed the deltopectoral interval deep to the cephalic vein with a running, 0-Vicryl suture. The skin was closed with 2-0 Vicryl and 4-0 Monocryl. Xeroform and Honeycomb dressing was applied. A PolarCare unit and sling were placed. Patient was extubated, transferred to a stretcher bed and to the post anesthesia care unit in stable condition.   Of note, assistance from a PA was essential to performing the surgery.  PA was present for the entire surgery.  PA assisted with patient positioning, retraction, instrumentation, and wound closure. The surgery would have been more difficult and had longer operative time without PA assistance.    POSTOPERATIVE PLAN: The patient will be discharged home from the PACU. Operative arm to remain in sling at all times except RoM exercises and hygiene. Can perform pendulums, elbow/wrist/hand RoM exercises. Passive RoM allowed to 90 FF and 30 ER. ASA 325mg  x 6 weeks for DVT ppx. Plan for PT starting on POD #3-7. Patient to return to clinic in ~2 weeks for post-operative appointment.

## 2023-09-01 NOTE — Anesthesia Procedure Notes (Signed)
 Procedure Name: Intubation Date/Time: 09/01/2023 7:52 AM  Performed by: Malva Cogan, CRNAPre-anesthesia Checklist: Patient identified, Patient being monitored, Timeout performed, Emergency Drugs available and Suction available Patient Re-evaluated:Patient Re-evaluated prior to induction Oxygen Delivery Method: Circle system utilized Preoxygenation: Pre-oxygenation with 100% oxygen Induction Type: IV induction Ventilation: Mask ventilation without difficulty Laryngoscope Size: 3 and McGrath Grade View: Grade II Tube type: Oral Tube size: 6.5 mm Number of attempts: 1 Airway Equipment and Method: Stylet Placement Confirmation: ETT inserted through vocal cords under direct vision, positive ETCO2 and breath sounds checked- equal and bilateral Secured at: 21 cm Tube secured with: Tape Dental Injury: Teeth and Oropharynx as per pre-operative assessment  Difficulty Due To: Difficult Airway- due to limited oral opening Comments: 6.5ETT placed.  Cuff not inflating.  Another 6.5 ETT replaced over bougie.

## 2023-09-01 NOTE — Discharge Instructions (Addendum)
 Janet Albee, MD  [facility]  Phone: [phone]  Fax: [phone]   Discharge Instructions after Reverse Shoulder Replacement    1. Activity/Sling: You are to be non-weight bearing on operative extremity. A sling/shoulder immobilizer has been provided for you. Only remove the sling to perform elbow, wrist, and hand RoM exercises and hygiene/dressing. Active reaching and lifting are not permitted. You will be given further instructions on sling use at your first physical therapy visit and postoperative visit with Dr. Allena Katz.   2. Dressings: Dressing may be removed at 1st physical therapy visit (~3-4 days after surgery). Afterwards, you may either leave open to air (if no drainage) or cover with dry, sterile dressing. If you have steri-strips on your wound, please do not remove them. They will fall off on their own. You may shower 5 days after surgery. Please pat incision dry. Do not rub or place any shear forces across incision. If there is drainage or any opening of incision after 5 days, please notify our offices immediately.    3. Driving:  Plan on not driving for six weeks. Please note that you are advised NOT to drive while taking narcotic pain medications as you may be impaired and unsafe to drive.   4. Medications:  - You have been provided a prescription for narcotic pain medicine (usually oxycodone). After surgery, take 1-2 narcotic tablets every 4 hours if needed for severe pain. Please start this as soon as you begin to start having pain (if you received a nerve block, start taking as soon as this wears off).  - A prescription for anti-nausea medication will be provided in case the narcotic medicine causes nausea - take 1 tablet every 6 hours only if nauseated.  - Take enteric coated aspirin 325 mg once daily for 6 weeks to prevent blood clots. Do not take aspirin if you have an aspirin sensitivity/allergy or asthma or are on an anticoagulant (blood thinner) already. If so, then  your home anticoagulant will be resume and managed - do not take aspirin. -Take tylenol 1000mg  (2 Extra strength or 3 regular strength tablets) every 8 hours for pain. This will reduce the amount of narcotic medication needed. May stop tylenol when you are having minimal pain. - Take a stool softener (Colace, Dulcolax or Senakot) if you are using narcotic pain medications to help with constipation that is associated with narcotic use. - DO NOT take ANY nonsteroidal anti-inflammatory pain medications: Advil, Motrin, Ibuprofen, Aleve, Naproxen, or Naprosyn.   If you are taking prescription medication for anxiety, depression, insomnia, muscle spasm, chronic pain, or for attention deficit disorder you are advised that you are at a higher risk of adverse effects with use of narcotics post-op, including narcotic addiction/dependence, depressed breathing, death. If you use non-prescribed substances: alcohol, marijuana, cocaine, heroin, methamphetamines, etc., you are at a higher risk of adverse effects with use of narcotics post-op, including narcotic addiction/dependence, depressed breathing, death. You are advised that taking > 50 morphine milligram equivalents (MME) of narcotic pain medication per day results in twice the risk of overdose or death. For your prescription provided: oxycodone 5 mg - taking more than 6 tablets per day after the first few days of surgery.   5. Physical Therapy: 1-2 times per week for ~12 weeks. Therapy typically starts on post operative Day 3 or 4. You have been provided an order for physical therapy. The therapist will provide home exercises. Please contact our offices if this appointment has not been scheduled.  6. Work: May do light duty/desk job in approximately 2 weeks when off of narcotics, pain is well-controlled, and swelling has decreased if able to function with one arm in sling. Full work may take 6 weeks if light motions and function of both arms is required.  Lifting jobs may require 12 weeks.   7. Post-Op Appointments: Your first post-op appointment will be with Dr. Allena Katz in approximately 2 weeks time.    If you find that they have not been scheduled please call the Orthopaedic Appointment front desk at [phone].                               Janet Albee, MD [facility] Phone: [phone] Fax: [phone]   REVERSE SHOULDER ARTHROPLASTY REHAB GUIDELINES   These guidelines should be tailored to individual patients based on their rehab goals, age, precautions, quality of repair, etc.  Progression should be based on patient progress and approval by the referring physician.  PHASE 1 - Day 1 through Week 2  GENERAL GUIDELINES AND PRECAUTIONS Sling wear 24/7 except during grooming and home exercises (3 to 5 times daily) Avoid shoulder extension such that the arm is posterior the frontal plane.  When patients recline, a pillow should be placed behind the upper arm and sling should be on.  They should be advised to always be able to see the elbow Avoid combined IR/ADD/EXT, such as hand behind back to prevent dislocation Avoid combined IR and ADD such as reaching across the chest to prevent dislocation No AROM No submersion in pool/water for 4 weeks No weight bearing through operative arm (as in transfers, walker use, etc.)  GOALS Maintain integrity of joint replacement; protect soft tissue healing Increase PROM for elevation to 120 and ER to 30 (will remain the goal for first 6 weeks) Optimize distal UE circulation and muscle activity (elbow, wrist and hand) Instruct in use of sling for proper fit, polar care device for ice application after HEP, signs/symptoms of infection  EXERCISES Active elbow, wrist and hand Passive forward elevation in scapular plane to 90-120 max motion; ER in scapular plane to 30 Active scapular retraction with arms resting in neutral position  CRITERIA TO PROGRESS TO  PHASE 2 Low pain (less than 3/10) with shoulder PROM Healing of incision without signs of infection Clearance by MD to advance after 2 week MD check up  PHASE 2 - 2 weeks - 6 weeks  GENERAL GUIDELINES AND PRECAUTIONS Sling may be removed while at home; worn in community without abduction pillow May use arm for light activities of daily living (such as feeding, brushing teeth, dressing.) with elbow near  the side of the body  and arm in front of the body- no active lifting of the arm May submerge in water (tub, pool, Byars, etc.) after 4 weeks Continue to avoid WBing through the operative arm Continue to avoid combined IR/EXT/ADD (hand behind the back) and IR/ADD  (reaching across chest) for dislocation precautions  GOALS  Achieve passive elevation to 120 and ER to 30  Low (less than 3/10) to no pain  Ability to fire all heads of the deltoid  EXERCISES May discontinue grip, and active elbow and wrist exercises since using the arm in ADL's  with sling removed around the home Continue passive elevation to 120 and ER to 30, both in scapular plane with arm supported on table top Add submaximal isometrics, pain free effort, for all  functional heads of deltoid (anterior, posterior, middle)  Ensure that with posterior deltoid isometric the shoulder does not move into extension and the arm remains anterior the frontal plane At 4 weeks:  begin to place arm in balanced position of 90 deg elevation in supine; when patient able to hold this position with ease, may begin reverse pendulums clockwise and counterclockwise  CRITERIA TO PROGRESS TO PHASE 3 Passive forward elevation in scapular plane to 120; passive ER in scapular plane to 30 Ability to fire isometrically all heads of the deltoid muscle without pain Ability to place and hold the arm in balanced position (90 deg elevation in supine)  PHASE 3 - 6 weeks to 3 months  GENERAL GUIDELINES AND PRECAUTIONS Discontinue use of sling Avoid  forcing end range motion in any direction to prevent dislocation  May advance use of the arm actively in ADL's without being restricted to arm by the side of the body, however, avoid heavy lifting and sports (forever!) May initiate functional IR behind the back gently NO UPPER BODY ERGOMETER   GOALS Optimize PROM for elevation and ER in scapular plane with realistic expectation that max  mobility for elevation is usually around 145-160 passively; ER 40 to 50 passively; functional IR to L1 Recover AROM to approach as close to PROM available as possible; may expect 135-150 deg active elevation; 30 deg active ER; active functional IR to L1 Establish dynamic stability of the shoulder with deltoid and periscapular muscle gradual strengthening  EXERCISES Forward elevation in scapular plane active progression: supine to incline, to vertical; short to long lever arm Balanced position long lever arm AROM Active ER/IR with arm at side Scapular retraction with light band resistance Functional IR with hand slide up back - very gentle and gradual NO UPPER BODY ERGOMETER     CRITERIA TO PROGRESS TO PHASE 4  AROM equals/approaches PROM with good mechanics for elevation   No pain  Higher level demand on shoulder than ADL functions   PHASE 4 12 months and beyond  GENERAL GUIDELINES AND PRECAUTIONS No heavy lifting and no overhead sports No heavy pushing activity Gradually increase strength of deltoid and scapular stabilizers; also the rotator cuff if present with weights not to exceed 5 lbs NO UPPER BODY ERGOMETER   GOALS  Optimize functional use of the operative UE to meet the desired demands  Gradual increase in deltoid, scapular muscle, and rotator cuff strength  Pain free functional activities   EXERCISES Add light hand weights for deltoid up to and not to exceed 3 lbs for anterior and posterior with long arm lift against gravity; elbow bent to 90 deg for abduction in scapular  plane Theraband progression for extension to hip with scapular depression/retraction Theraband progression for serratus anterior punches in supine; avoid wall, incline or prone pressups for serratus anterior End range stretching gently without forceful overpressure in all planes (elevation in scapular plane, ER in scapular plane, functional IR) with stretching done for life as part of a daily routine NO UPPER BODY ERGOMETER     CRITERIA FOR DISCHARGE FROM SKILLED PHYSICAL THERAPY  Pain free AROM for shoulder elevation (expect around 135-150)  Functional strength for all ADL's, work tasks, and hobbies approved by Careers adviser  Independence with home maintenance program   NOTES: 1. With proper exercise, motion, strength, and function continue to improve even after one year. 2. The complication rate after surgery is 5 - 8%. Complications include infection, fracture, heterotopic bone formation, nerve injury, instability, rotator cuff  tear, and tuberosity nonunion. Please look for clinical signs, unusual symptoms, or lack of progress with therapy and report those to Dr. Allena Katz. Prefer more communication than less.  3. The therapy plan above only serves as a guide. Please be aware of specific individualized patient instructions as written on the prescription or through discussions with the surgeon. 4. Please call Dr. Allena Katz if you have any specific questions or concerns [phone]   Information for Discharge Teaching: EXPAREL (bupivacaine liposome injectable suspension)   Pain relief is important to your recovery. The goal is to control your pain so you can move easier and return to your normal activities as soon as possible after your procedure. Your physician may use several types of medicines to manage pain, swelling, and more.  Your surgeon or anesthesiologist gave you EXPAREL(bupivacaine) to help control your pain after surgery.  EXPAREL is a local anesthetic designed to release slowly over an  extended period of time to provide pain relief by numbing the tissue around the surgical site. EXPAREL is designed to release pain medication over time and can control pain for up to 72 hours. Depending on how you respond to EXPAREL, you may require less pain medication during your recovery. EXPAREL can help reduce or eliminate the need for opioids during the first few days after surgery when pain relief is needed the most. EXPAREL is not an opioid and is not addictive. It does not cause sleepiness or sedation.   Important! A teal colored band has been placed on your arm with the date, time and amount of EXPAREL you have received. Please leave this armband in place for the full 96 hours following administration, and then you may remove the band. If you return to the hospital for any reason within 96 hours following the administration of EXPAREL, the armband provides important information that your health care providers to know, and alerts them that you have received this anesthetic.    Possible side effects of EXPAREL: Temporary loss of sensation or ability to move in the area where medication was injected. Nausea, vomiting, constipation Rarely, numbness and tingling in your mouth or lips, lightheadedness, or anxiety may occur. Call your doctor right away if you think you may be experiencing any of these sensations, or if you have other questions regarding possible side effects.  Follow all other discharge instructions given to you by your surgeon or nurse. Eat a healthy diet and drink plenty of water or other fluids.  POLAR CARE INFORMATION  MassAdvertisement.it  How to use [facility] Polar Care [facility] Therapy System?  YouTube   ShippingScam.co.uk  OPERATING INSTRUCTIONS  Start the product With dry hands, connect the transformer to the electrical connection located on the top of the cooler. Next, plug the transformer into an appropriate electrical outlet. The unit will  automatically start running at this point.  To stop the pump, disconnect electrical power.  Unplug to stop the product when not in use. Unplugging the Polar Care unit turns it off. Always unplug immediately after use. Never leave it plugged in while unattended. Remove pad.    FIRST ADD WATER TO FILL LINE, THEN ICE---Replace ice when existing ice is almost melted  1 Discuss Treatment with your Licensed Health Care Practitioner and Use Only as Prescribed 2 Apply Insulation Barrier & Cold Therapy Pad 3 Check for Moisture 4 Inspect Skin Regularly  Tips and Trouble Shooting Usage Tips 1. Use cubed or chunked ice for optimal performance. 2. It is recommended to drain the  Pad between uses. To drain the pad, hold the Pad upright with the hose pointed toward the ground. Depress the black plunger and allow water to drain out. 3. You may disconnect the Pad from the unit without removing the pad from the affected area by depressing the silver tabs on the hose coupling and gently pulling the hoses apart. The Pad and unit will seal itself and will not leak. Note: Some dripping during release is normal. 4. DO NOT RUN PUMP WITHOUT WATER! The pump in this unit is designed to run with water. Running the unit without water will cause permanent damage to the pump. 5. Unplug unit before removing lid.  TROUBLESHOOTING GUIDE Pump not running, Water not flowing to the pad, Pad is not getting cold 1. Make sure the transformer is plugged into the wall outlet. 2. Confirm that the ice and water are filled to the indicated levels. 3. Make sure there are no kinks in the pad. 4. Gently pull on the blue tube to make sure the tube/pad junction is straight. 5. Remove the pad from the treatment site and ll it while the pad is lying at; then reapply. 6. Confirm that the pad couplings are securely attached to the unit. Listen for the double clicks (Figure 1) to confirm the pad couplings are securely attached.  Leaks    Note:  Some condensation on the lines, controller, and pads is unavoidable, especially in warmer climates. 1. If using a [employer] Polar Care Cold Therapy unit with a detachable Cold Therapy Pad, and a leak exists (other than condensation on the lines) disconnect the pad couplings. Make sure the silver tabs on the couplings are depressed before reconnecting the pad to the pump hose; then confirm both sides of the coupling are properly clicked in. 2. If the coupling continues to leak or a leak is detected in the pad itself, stop using it and call [employer] Customer Care at [phone].  Cleaning After use, empty and dry the unit with a soft cloth. Warm water and mild detergent may be used occasionally to clean the pump and tubes.  WARNING: The Polar Care Cube can be cold enough to cause serious injury, including full skin necrosis. Follow these Operating Instructions, and carefully read the Product Insert (see pouch on side of unit) and the Cold Therapy Pad Fitting Instructions (provided with each Cold Therapy Pad) prior to use.   SHOULDER SLING IMMOBILIZER   VIDEO Slingshot 2 Shoulder Brace Application - YouTube ---https://www.porter.info/  INSTRUCTIONS While supporting the injured arm, slide the forearm into the sling. Wrap the adjustable shoulder strap around the neck and shoulders and attach the strap end to the sling using  the "alligator strap tab."  Adjust the shoulder strap to the required length. Position the shoulder pad behind the neck. To secure the shoulder pad location (optional), pull the shoulder strap away from the shoulder pad, unfold the hook material on the top of the pad, then press the shoulder strap back onto the hook material to secure the pad in place. Attach the closure strap across the open top of the sling. Position the strap so that it holds the arm securely in the sling. Next, attach the thumb strap to the open end of the sling between the thumb and  fingers. After sling has been fit, it may be easily removed and reapplied using the quick release buckle on shoulder strap. If a neutral pillow or 15 abduction pillow is included, place the pillow at the waistline.  Attach the sling to the pillow, lining up hook material on the pillow with the loop on sling. Adjust the waist strap to fit.  If waist strap is too long, cut it to fit. Use the small piece of double sided hook material (located on top of the pillow) to secure the strap end. Place the double sided hook material on the inside of the cut strap end and secure it to the waist strap.     If no pillow is included, attach the waist strap to the sling and adjust to fit.    Washing Instructions: Straps and sling must be removed and cleaned regularly depending on your activity level and perspiration. Hand wash straps and sling in cold water with mild detergent, rinse, air dry

## 2023-09-01 NOTE — Transfer of Care (Signed)
 Immediate Anesthesia Transfer of Care Note  Patient: Janet Spears  Procedure(s) Performed: Left reverse shoulder arthroplasty, biceps tenodesis (Left: Shoulder)  Patient Location: PACU  Anesthesia Type:General  Level of Consciousness: awake and alert   Airway & Oxygen Therapy: Patient Spontanous Breathing and Patient connected to face mask oxygen  Post-op Assessment: Report given to RN and Post -op Vital signs reviewed and stable  Post vital signs: Reviewed and stable  Last Vitals:  Vitals Value Taken Time  BP 141/57 09/01/23 1021  Temp    Pulse 69 09/01/23 1023  Resp 14 09/01/23 1023  SpO2 100 % 09/01/23 1023  Vitals shown include unfiled device data.  Last Pain:  Vitals:   09/01/23 1610  TempSrc: Temporal  PainSc: 0-No pain         Complications: No notable events documented.

## 2023-09-01 NOTE — Evaluation (Signed)
 Occupational Therapy Evaluation Patient Details Name: Janet Spears MRN: [id number] DOB: [birth year] Today's Date: 09/01/2023   History of Present Illness   88 y/o female s/p L reverse TSA and biceps tenodesis on 09/01/23. PMH: HTN, gastroenteritis     Clinical Impressions Patient was seen for an OT evaluation this date. Pt daughter present throughout for education. PTA pt is MOD I-I in ADL/IADL, PRN use of walking stick. Pt and daughter provided edu re: polar care mgt, sling/immobilizer mgt, ROM exercises for LUE, LUE precautions, adaptive strategies for bathing/dressing/toileting/grooming, positioning and considerations for sleep, and home/routines modifications to maximize falls prevention, safety, and independence. Handout provided. OT adjusted sling/immobilizer and polar care to improve comfort, optimize positioning, and to maximize skin integrity/safety. Pt/family verbalized understanding of all education/training provided. Pt will benefit from skilled OT services to address these limitations and improve independence in daily tasks.Follow up therapy per physician protocol.     If plan is discharge home, recommend the following:   A little help with walking and/or transfers;A little help with bathing/dressing/bathroom;Assistance with cooking/housework;Help with stairs or ramp for entrance;Assist for transportation     Functional Status Assessment   Patient has had a recent decline in their functional status and demonstrates the ability to make significant improvements in function in a reasonable and predictable amount of time.     Equipment Recommendations   None recommended by OT     Recommendations for Other Services         Precautions/Restrictions   Precautions Precautions: Shoulder;Fall Shoulder Interventions: Shoulder sling/immobilizer;Shoulder abduction pillow;Off for dressing/bathing/exercises Precaution Booklet Issued: Yes (comment) Recall of  Precautions/Restrictions: Intact Restrictions Weight Bearing Restrictions Per Provider Order: Yes LUE Weight Bearing Per Provider Order: Non weight bearing     Mobility Bed Mobility               General bed mobility comments: NT in recliner pre/post session    Transfers Overall transfer level: Needs assistance Equipment used: 1 person hand held assist Transfers: Sit to/from Stand Sit to Stand: Contact guard assist                  Balance Overall balance assessment: Needs assistance Sitting-balance support: Feet supported Sitting balance-Leahy Scale: Good     Standing balance support: Single extremity supported Standing balance-Leahy Scale: Good                             ADL either performed or assessed with clinical judgement   ADL Overall ADL's : Needs assistance/impaired Eating/Feeding: Set up;Sitting   Grooming: Set up;Sitting           Upper Body Dressing : Maximal assistance;Adhering to UE precautions Upper Body Dressing Details (indicate cue type and reason): sling, shirt; educated pt/daugther re: hemi dressing techniques, hand out provided                 Functional mobility during ADLs: Contact guard assist;Cueing for sequencing (household distances)       Vision Patient Visual Report: No change from baseline       Perception         Praxis         Pertinent Vitals/Pain Pain Assessment Pain Assessment: No/denies pain     Extremity/Trunk Assessment Upper Extremity Assessment Upper Extremity Assessment: Right hand dominant;LUE deficits/detail LUE Deficits / Details: shoulder NT; AROM: approx 1/4 full ROM, wrist/hand 3/4 full ROM; weak grip LUE Sensation: decreased light touch LUE  Coordination: decreased fine motor;decreased gross motor   Lower Extremity Assessment Lower Extremity Assessment: Generalized weakness       Communication Communication Communication: No apparent difficulties   Cognition  Arousal: Alert Behavior During Therapy: WFL for tasks assessed/performed Cognition: No apparent impairments                               Following commands: Intact       Cueing  General Comments      pt reports mild dizziness; vitals monitored, WFL   Exercises     Shoulder Instructions      Home Living Family/patient expects to be discharged to:: Private residence Living Arrangements: Alone Available Help at Discharge: Family;Available 24 hours/day Type of Home: Apartment Home Access: Stairs to enter Entrance Stairs-Number of Steps: 20- but uses chair lift Entrance Stairs-Rails: Right Home Layout: One level     Bathroom Shower/Tub: Producer, television/film/video: Handicapped height     Home Equipment: Grab bars - tub/shower;Grab bars - toilet   Additional Comments: pt reports she uses walking stick      Prior Functioning/Environment Prior Level of Function : Independent/Modified Independent;Driving             Mobility Comments: PRN use of walking stick ADLs Comments: MOD I-I in ADL/IADL    OT Problem List: Decreased strength;Decreased range of motion;Decreased activity tolerance;Impaired balance (sitting and/or standing);Decreased knowledge of use of DME or AE   OT Treatment/Interventions: Self-care/ADL training;Therapeutic exercise;Therapeutic activities;DME and/or AE instruction;Patient/family education      OT Goals(Current goals can be found in the care plan section)   Acute Rehab OT Goals Patient Stated Goal: go home OT Goal Formulation: With patient/family Time For Goal Achievement: 09/15/23 Potential to Achieve Goals: Good ADL Goals Pt Will Perform Grooming: with modified independence;sitting;standing Pt Will Perform Upper Body Dressing: with caregiver independent in assisting;with modified independence;sitting Pt Will Perform Lower Body Dressing: with modified independence;sit to/from stand;sitting/lateral leans Pt Will  Transfer to Toilet: with modified independence;ambulating Pt Will Perform Toileting - Clothing Manipulation and hygiene: with modified independence;sit to/from stand;sitting/lateral leans   OT Frequency:  Min 1X/week    Co-evaluation              AM-PAC OT "6 Clicks" Daily Activity     Outcome Measure Help from another person eating meals?: None Help from another person taking care of personal grooming?: None Help from another person toileting, which includes using toliet, bedpan, or urinal?: None Help from another person bathing (including washing, rinsing, drying)?: A Little Help from another person to put on and taking off regular upper body clothing?: A Lot Help from another person to put on and taking off regular lower body clothing?: A Little 6 Click Score: 20   End of Session Equipment Utilized During Treatment: Other (comment) (sling) Nurse Communication: Mobility status  Activity Tolerance: Patient tolerated treatment well Patient left: in chair;with call bell/phone within reach;with family/visitor present  OT Visit Diagnosis: Other abnormalities of gait and mobility (R26.89)                Time: 1610-9604 OT Time Calculation (min): 22 min Charges:  OT General Charges $OT Visit: 1 Visit OT Evaluation $OT Eval Low Complexity: 1 Low  Oleta Mouse, OTD OTR/L  09/01/23, 12:36 PM

## 2023-09-01 NOTE — Anesthesia Preprocedure Evaluation (Signed)
 Anesthesia Evaluation  Patient identified by MRN, date of birth, ID band Patient awake    Reviewed: Allergy & Precautions, NPO status , Patient's Chart, lab work & pertinent test results  History of Anesthesia Complications (+) PONV and history of anesthetic complications  Airway Mallampati: IV   Neck ROM: Full  Mouth opening: Limited Mouth Opening  Dental  (+) Teeth Intact, Dental Advidsory Given, [place] on top left:   Pulmonary neg pulmonary ROS   Pulmonary exam normal breath sounds clear to auscultation       Cardiovascular Exercise Tolerance: Good hypertension, (-) angina + CAD  (-) Past MI and (-) Cardiac Stents Normal cardiovascular exam(-) dysrhythmias (-) Valvular Problems/Murmurs Rhythm:Regular Rate:Normal  ECG 12/27/20:  Sinus rhythm Low voltage, precordial leads Borderline T abnormalities, inferior leads  Echo 01/02/21:  NORMAL LEFT VENTRICULAR SYSTOLIC FUNCTION  NORMAL RIGHT VENTRICULAR SYSTOLIC FUNCTION  VALVULAR REGURGITATION: MILD MR, TRIVIAL PR, TRIVIAL TR  NO VALVULAR STENOSIS  NO PRIOR ECHO STUDY FOR COMPARISON  NEGATIVE SALINE CONTRAST STUDY   Loop recorder 01/03/21:  1. Study quality adequate for interpretation. 2. Manually and automatically detected events include episodes corresponding to sinus rhythm, some with baseline artifact, but regular suggesting more likely sinus rhythm than AFib.   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 No date: Aortic atherosclerosis (HCC) No date: Arthritis No date: BPV (benign positional  vertigo) 07/22/2007: Coronary artery disease     Comment:  a.) LHC 07/22/2007: 20% oLM - med mgmt; b.) LHC               07/27/2013: 10% pLAD - med mgmt No date: CTS (carpal tunnel syndrome) No date: Diverticulosis No date: GERD (gastroesophageal reflux disease) No date: Hemorrhoids No date: Hiatal hernia No date: Hypercholesteremia No date: Hypertension No date: Hypothyroidism No date: Long-term use of aspirin therapy No date: PONV (postoperative nausea and vomiting) No date: Sleeping difficulties No date: Stroke [facility])     Comment:  a.) MRI brain 01/02/2021: chronic infarct RIGHT basal               ganglia; old small lacunar infarcts of  the BILATERAL               cerebral hemispheres No date: Tear of left supraspinatus tendon No date: TIA (transient ischemic attack)     Comment:  x2-1st one in her 23's and 2nd one in 2022 No date: Tremor of both hands No date: Tubular adenoma No date: Urinary incontinence 2016: Vasovagal syncope No date: Vitamin D deficiency   Reproductive/Obstetrics negative OB ROS                             Anesthesia Physical Anesthesia Plan  ASA: 2  Anesthesia Plan: General   Post-op Pain Management: Regional block*   Induction: Intravenous  PONV Risk Score and Plan: 4 or greater and Treatment may vary due to age or medical condition, Ondansetron and Dexamethasone  Airway Management Planned: Oral ETT  Additional Equipment:   Intra-op Plan:   Post-operative Plan: Extubation in OR  Informed Consent: I have  reviewed the patients History and Physical, chart, labs and discussed the procedure including the risks, benefits and alternatives for the proposed anesthesia with the patient or authorized representative who has indicated his/her understanding and acceptance.       Plan Discussed with: CRNA  Anesthesia Plan Comments:         Anesthesia Quick Evaluation

## 2023-09-01 NOTE — H&P (Signed)
 Paper H&P to be scanned into permanent record. H&P reviewed. No significant changes noted.

## 2023-09-01 NOTE — Progress Notes (Signed)
 Patient up to Bathroom already.  Hemovac drain removed with no complications.  Plan to d/c home today.

## 2023-09-01 NOTE — Evaluation (Signed)
 Physical Therapy Evaluation Patient Details Name: TERENCE GOOGE MRN: [id number] DOB: March 25, 1936 Today's Date: 09/01/2023  History of Present Illness  88 y/o female s/p L reverse TSA and biceps tenodesis on 09/01/23. PMH: HTN, gastroenteritis  Clinical Impression  Patient seen following above procedure. PTA, patient lives alone in an apartment above a garage and was independent with mobility. Patient unsteady during mobility but likely due to anesthesia effects. Required CGA for mobility with HHAx1. No overt LOB but encouraged patient to utilize chair lift to negotiate stairs for safety. Patient reports that she will have family assistance at discharge. OT present at end of session for further education about LUE precautions. Patient will benefit from skilled PT services during acute stay to address listed deficits. Patient will benefit from ongoing therapy at discharge to maximize functional independence and safety.       If plan is discharge home, recommend the following: A little help with walking and/or transfers;A little help with bathing/dressing/bathroom;Assistance with cooking/housework;Assist for transportation;Help with stairs or ramp for entrance   Can travel by private vehicle        Equipment Recommendations Cane  Recommendations for Other Services       Functional Status Assessment Patient has had a recent decline in their functional status and demonstrates the ability to make significant improvements in function in a reasonable and predictable amount of time.     Precautions / Restrictions Precautions Precautions: Shoulder;Fall Shoulder Interventions: Shoulder sling/immobilizer;At all times;Shoulder abduction pillow Restrictions Weight Bearing Restrictions Per Provider Order: Yes LUE Weight Bearing Per Provider Order: Non weight bearing      Mobility  Bed Mobility               General bed mobility comments: sitting in recliner on arrival     Transfers Overall transfer level: Needs assistance Equipment used: 1 person hand held assist Transfers: Sit to/from Stand Sit to Stand: Contact guard assist                Ambulation/Gait Ambulation/Gait assistance: Contact guard assist Gait Distance (Feet): 70 Feet Assistive device: 1 person hand held assist Gait Pattern/deviations: Step-through pattern, Decreased stride length Gait velocity: decr     General Gait Details: slow guarded gait. Unsteady likely due to anesthesia. HHA for support  Stairs            Wheelchair Mobility     Tilt Bed    Modified Rankin (Stroke Patients Only)       Balance Overall balance assessment: Mild deficits observed, not formally tested                                           Pertinent Vitals/Pain Pain Assessment Pain Assessment: No/denies pain    Home Living Family/patient expects to be discharged to:: Private residence Living Arrangements: Alone Available Help at Discharge: Family;Available 24 hours/day Type of Home: Apartment Home Access: Stairs to enter Entrance Stairs-Rails: Right Entrance Stairs-Number of Steps: 20 (but has chair lift)   Home Layout: One level Home Equipment: Grab bars - tub/shower;Grab bars - toilet ("stick")      Prior Function Prior Level of Function : Independent/Modified Independent;Driving             Mobility Comments: no AD - patient reports occasional use of "stick"       Extremity/Trunk Assessment   Upper Extremity Assessment Upper Extremity Assessment: Defer  to OT evaluation    Lower Extremity Assessment Lower Extremity Assessment: Generalized weakness       Communication   Communication Communication: No apparent difficulties    Cognition Arousal: Alert Behavior During Therapy: WFL for tasks assessed/performed   PT - Cognitive impairments: No apparent impairments                         Following commands: Intact        Cueing       General Comments      Exercises     Assessment/Plan    PT Assessment Patient needs continued PT services  PT Problem List Decreased strength;Decreased activity tolerance;Decreased mobility;Decreased balance;Decreased safety awareness;Decreased knowledge of precautions;Decreased knowledge of use of DME       PT Treatment Interventions DME instruction;Gait training;Therapeutic activities;Therapeutic exercise;Functional mobility training;Stair training;Balance training;Patient/family education    PT Goals (Current goals can be found in the Care Plan section)  Acute Rehab PT Goals Patient Stated Goal: to go home PT Goal Formulation: With patient/family Time For Goal Achievement: 09/15/23 Potential to Achieve Goals: Good    Frequency Min 3X/week     Co-evaluation               AM-PAC PT "6 Clicks" Mobility  Outcome Measure Help needed turning from your back to your side while in a flat bed without using bedrails?: A Little Help needed moving from lying on your back to sitting on the side of a flat bed without using bedrails?: A Little Help needed moving to and from a bed to a chair (including a wheelchair)?: A Little Help needed standing up from a chair using your arms (e.g., wheelchair or bedside chair)?: A Little Help needed to walk in hospital room?: A Little Help needed climbing 3-5 steps with a railing? : A Little 6 Click Score: 18    End of Session Equipment Utilized During Treatment: Other (comment) (L shoulder immobilizer) Activity Tolerance: Patient tolerated treatment well Patient left: in chair;Other (comment) (OT present) Nurse Communication: Mobility status PT Visit Diagnosis: Unsteadiness on feet (R26.81);Muscle weakness (generalized) (M62.81)    Time: 5784-6962 PT Time Calculation (min) (ACUTE ONLY): 10 min   Charges:   PT Evaluation $PT Eval Moderate Complexity: 1 Mod   PT General Charges $$ ACUTE PT VISIT: 1 Visit          Maylon Peppers, PT, DPT Physical Therapist - [facility]  [facility]   Ervin Rothbauer A Henley Blyth 09/01/2023, 12:13 PM

## 2023-09-01 NOTE — Progress Notes (Signed)
 Pt & OT here going over instrustions with daughter Janett Billow.

## 2023-09-01 NOTE — Anesthesia Procedure Notes (Signed)
 Anesthesia Regional Block: Interscalene brachial plexus block   Pre-Anesthetic Checklist: , timeout performed,  Correct Patient, Correct Site, Correct Laterality,  Correct Procedure, Correct Position, site marked,  Risks and benefits discussed,  Surgical consent,  Pre-op evaluation,  At surgeon's request and post-op pain management  Laterality: Left and Upper  Prep: chloraprep       Needles:  Injection technique: Single-shot  Needle Type: Stimiplex     Needle Length: 5cm  Needle Gauge: 22     Additional Needles:   Procedures:,,,, ultrasound used (permanent image in chart),,    Narrative:  Start time: 09/01/2023 7:32 AM End time: 09/01/2023 7:34 AM Injection made incrementally with aspirations every 5 mL.  Performed by: Personally  Anesthesiologist: Lenard Simmer, MD  Additional Notes: Functioning IV was confirmed and monitors were applied.  A 50mm 22ga Stimuplex needle was used. Sterile prep and drape,hand hygiene and sterile gloves were used.  Negative aspiration and negative test dose prior to incremental administration of local anesthetic. The patient tolerated the procedure well.

## 2023-09-02 ENCOUNTER — Encounter: Payer: Self-pay | Admitting: Orthopedic Surgery

## 2023-09-04 NOTE — Anesthesia Postprocedure Evaluation (Signed)
 Anesthesia Post Note  Patient: Janet Spears  Procedure(s) Performed: Left reverse shoulder arthroplasty, biceps tenodesis (Left: Shoulder)  Patient location during evaluation: PACU Anesthesia Type: General Level of consciousness: awake and alert Pain management: pain level controlled Vital Signs Assessment: post-procedure vital signs reviewed and stable Respiratory status: spontaneous breathing, nonlabored ventilation, respiratory function stable and patient connected to nasal cannula oxygen Cardiovascular status: blood pressure returned to baseline and stable Postop Assessment: no apparent nausea or vomiting Anesthetic complications: no   No notable events documented.   Last Vitals:  Vitals:   09/01/23 1120 09/01/23 1135  BP: (!) 148/63 (!) 135/57  Pulse: 70 (!) 59  Resp: 16 18  Temp: 36.4 C   SpO2: 95% 98%    Last Pain:  Vitals:   09/02/23 0851  TempSrc:   PainSc: 3                  Lenard Simmer
